# Patient Record
Sex: Female | Born: 1950 | Race: Black or African American | Hispanic: No | Marital: Married | Smoking: Current some day smoker
Health system: Southern US, Community
[De-identification: ages and names within clinical notes are randomized; demographics above are authoritative.]

## PROBLEM LIST (undated history)

## (undated) DIAGNOSIS — N200 Calculus of kidney: Secondary | ICD-10-CM

## (undated) DIAGNOSIS — I10 Essential (primary) hypertension: Secondary | ICD-10-CM

## (undated) DIAGNOSIS — K219 Gastro-esophageal reflux disease without esophagitis: Secondary | ICD-10-CM

## (undated) DIAGNOSIS — R7303 Prediabetes: Secondary | ICD-10-CM

## (undated) DIAGNOSIS — E7800 Pure hypercholesterolemia, unspecified: Secondary | ICD-10-CM

## (undated) HISTORY — PX: KIDNEY STONE SURGERY: SHX686

## (undated) HISTORY — PX: ABDOMINAL HYSTERECTOMY: SHX81

## (undated) HISTORY — PX: OTHER SURGICAL HISTORY: SHX169

---

## 2000-06-17 ENCOUNTER — Observation Stay (HOSPITAL_COMMUNITY): Admission: EM | Admit: 2000-06-17 | Discharge: 2000-06-18 | Payer: Self-pay | Admitting: *Deleted

## 2000-06-17 ENCOUNTER — Encounter: Payer: Self-pay | Admitting: *Deleted

## 2000-06-18 ENCOUNTER — Encounter: Payer: Self-pay | Admitting: Internal Medicine

## 2001-04-06 ENCOUNTER — Ambulatory Visit (HOSPITAL_COMMUNITY): Admission: RE | Admit: 2001-04-06 | Discharge: 2001-04-06 | Payer: Self-pay | Admitting: *Deleted

## 2001-04-06 ENCOUNTER — Encounter: Payer: Self-pay | Admitting: *Deleted

## 2002-07-31 ENCOUNTER — Ambulatory Visit (HOSPITAL_COMMUNITY): Admission: RE | Admit: 2002-07-31 | Discharge: 2002-07-31 | Payer: Self-pay | Admitting: *Deleted

## 2002-07-31 ENCOUNTER — Encounter: Payer: Self-pay | Admitting: *Deleted

## 2003-09-11 ENCOUNTER — Emergency Department (HOSPITAL_COMMUNITY): Admission: EM | Admit: 2003-09-11 | Discharge: 2003-09-11 | Payer: Self-pay | Admitting: Emergency Medicine

## 2006-10-18 ENCOUNTER — Emergency Department (HOSPITAL_COMMUNITY): Admission: EM | Admit: 2006-10-18 | Discharge: 2006-10-18 | Payer: Self-pay | Admitting: Emergency Medicine

## 2006-10-22 ENCOUNTER — Emergency Department (HOSPITAL_COMMUNITY): Admission: EM | Admit: 2006-10-22 | Discharge: 2006-10-22 | Payer: Self-pay | Admitting: Emergency Medicine

## 2007-01-02 ENCOUNTER — Ambulatory Visit (HOSPITAL_COMMUNITY): Admission: RE | Admit: 2007-01-02 | Discharge: 2007-01-02 | Payer: Self-pay | Admitting: Neurological Surgery

## 2007-05-21 ENCOUNTER — Inpatient Hospital Stay (HOSPITAL_COMMUNITY): Admission: EM | Admit: 2007-05-21 | Discharge: 2007-05-23 | Payer: Self-pay | Admitting: Emergency Medicine

## 2007-06-22 ENCOUNTER — Inpatient Hospital Stay (HOSPITAL_COMMUNITY): Admission: RE | Admit: 2007-06-22 | Discharge: 2007-06-26 | Payer: Self-pay | Admitting: Internal Medicine

## 2007-06-22 ENCOUNTER — Encounter: Payer: Self-pay | Admitting: Obstetrics and Gynecology

## 2007-07-12 ENCOUNTER — Ambulatory Visit (HOSPITAL_COMMUNITY): Admission: RE | Admit: 2007-07-12 | Discharge: 2007-07-12 | Payer: Self-pay | Admitting: Urology

## 2007-08-04 ENCOUNTER — Emergency Department (HOSPITAL_COMMUNITY): Admission: EM | Admit: 2007-08-04 | Discharge: 2007-08-04 | Payer: Self-pay | Admitting: Emergency Medicine

## 2007-11-03 ENCOUNTER — Ambulatory Visit (HOSPITAL_COMMUNITY): Admission: RE | Admit: 2007-11-03 | Discharge: 2007-11-03 | Payer: Self-pay | Admitting: Urology

## 2007-11-15 ENCOUNTER — Ambulatory Visit (HOSPITAL_COMMUNITY): Admission: RE | Admit: 2007-11-15 | Discharge: 2007-11-15 | Payer: Self-pay | Admitting: Urology

## 2008-07-12 ENCOUNTER — Ambulatory Visit (HOSPITAL_COMMUNITY): Admission: RE | Admit: 2008-07-12 | Discharge: 2008-07-12 | Payer: Self-pay | Admitting: Urology

## 2009-06-05 IMAGING — CT CT ABDOMEN W/ CM
1 of 3 series · 14 of 32 positions shown, 19 images · IV contrast (Omnipaque 300)
Comparison: None

CLINICAL DATA: Abdominal pain

CT ABDOMEN WITH CONTRAST,CT PELVIS WITH CONTRAST
TECHNIQUE: Multidetector CT imaging of the abdomen was performed
following the standard protocol during bolus administration of
intravenous contrast.,Technique:  Multidetector CT imaging of the
pelvis was performed following the standard protocol during
Contrast: 3 ml Rmnipaque-4HH

[Series 2: abd_pel 5.0 b40f · axial · 0.69mm/px · z∈[-506,-106]mm · 14 of 92 slices shown, 19 images]
[im 6/92  soft-tissue]
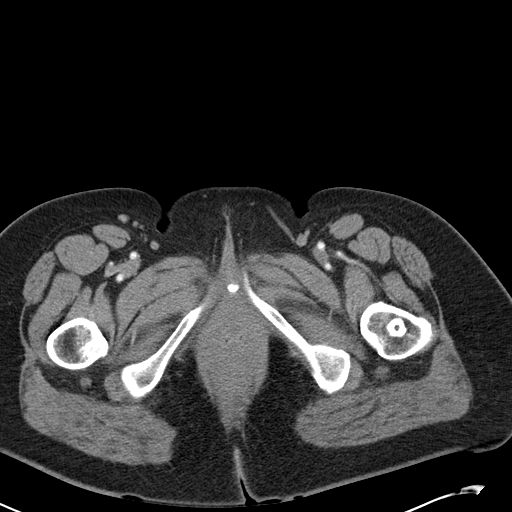
[im 6/92  bone]
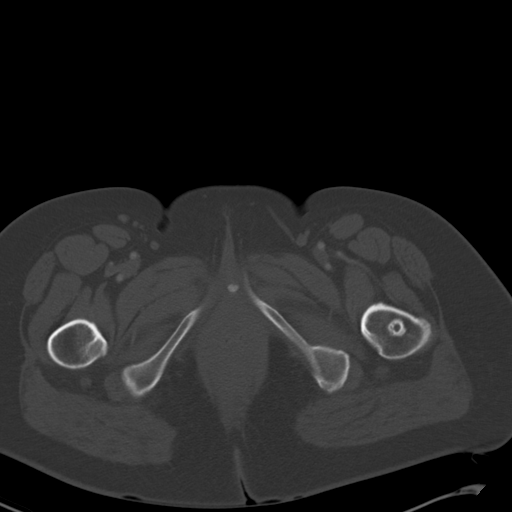
[im 12/92  soft-tissue]
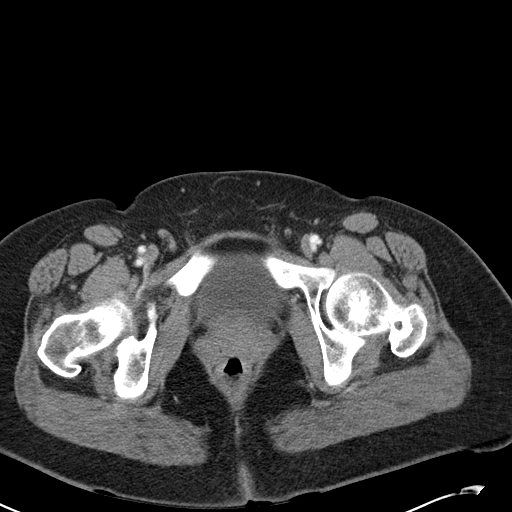
[im 18/92  soft-tissue]
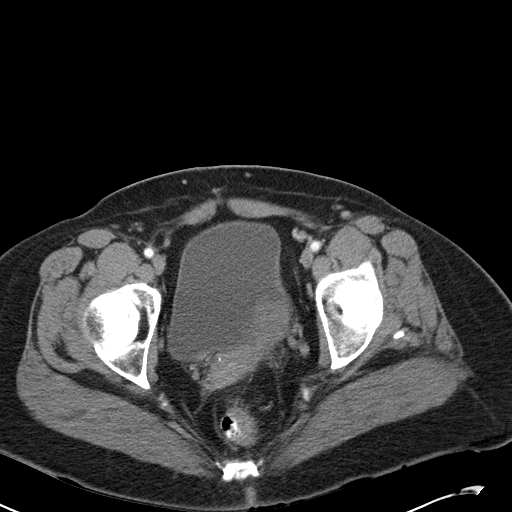
[im 29/92  soft-tissue]
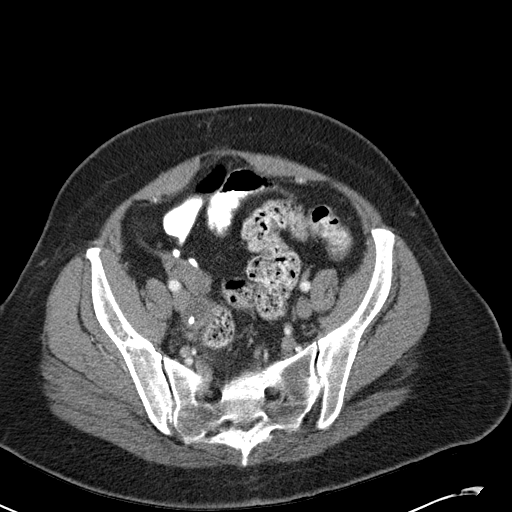
[im 35/92  soft-tissue]
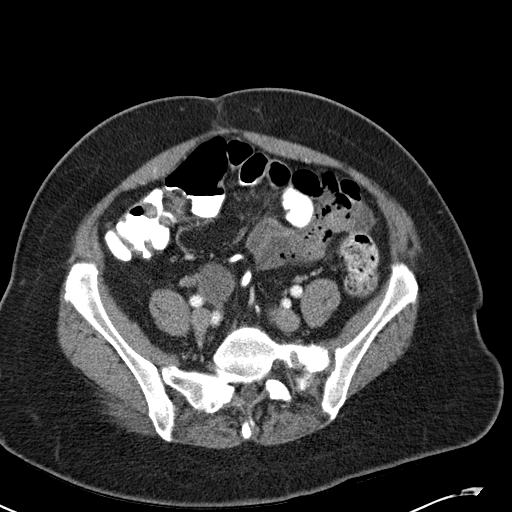
[im 40/92  soft-tissue]
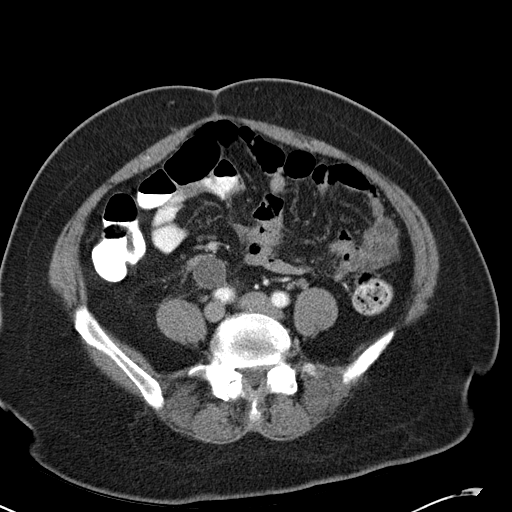
[im 46/92  soft-tissue]
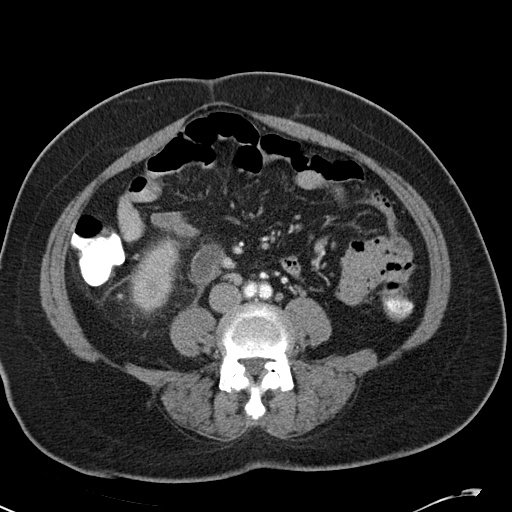
[im 52/92  soft-tissue]
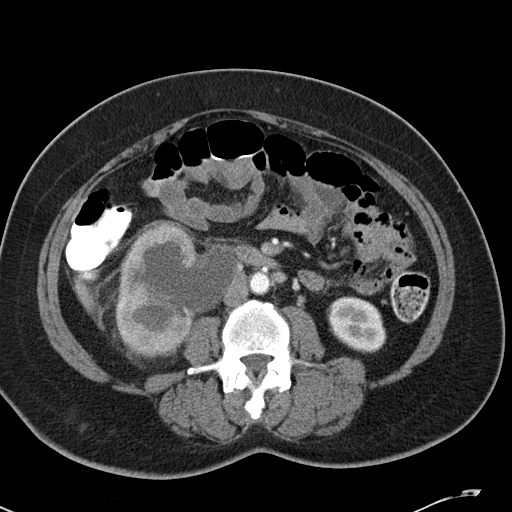
[im 57/92  soft-tissue]
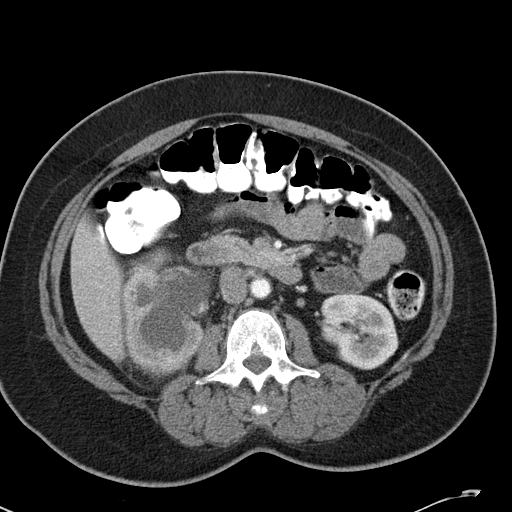
[im 57/92  bone]
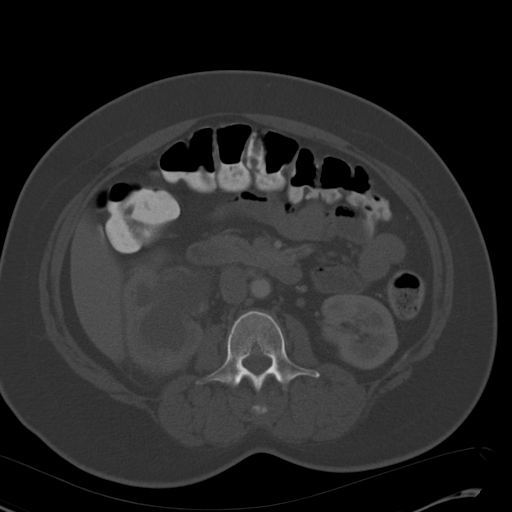
[im 63/92  soft-tissue]
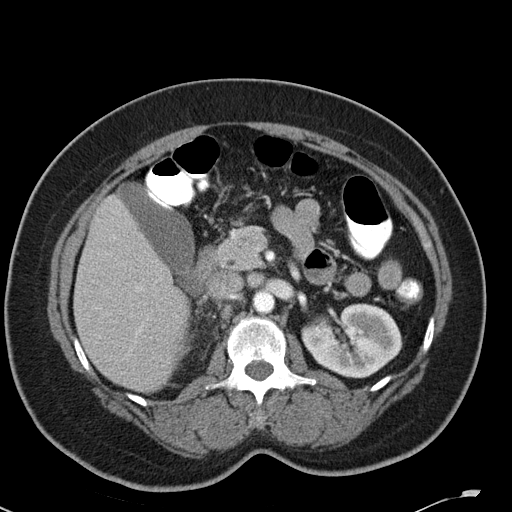
[im 69/92  lung]
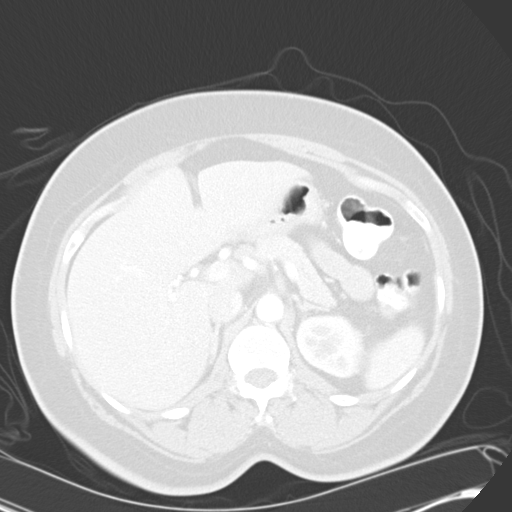
[im 74/92  soft-tissue]
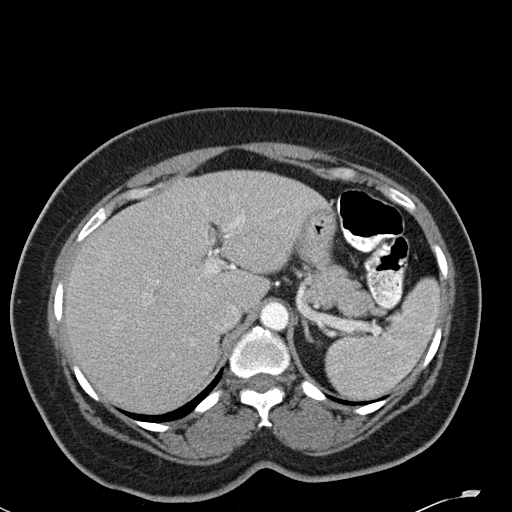
[im 74/92  lung]
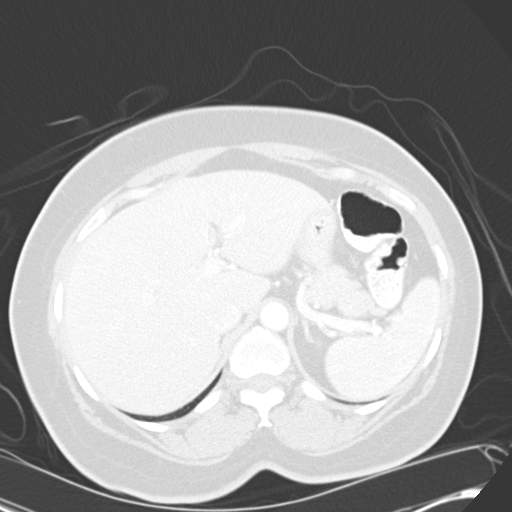
[im 80/92  soft-tissue]
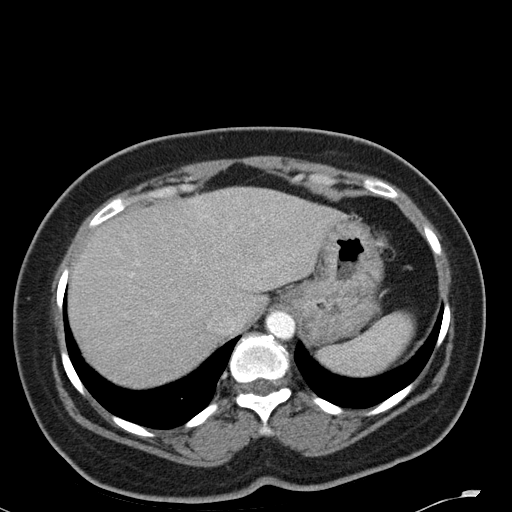
[im 80/92  lung]
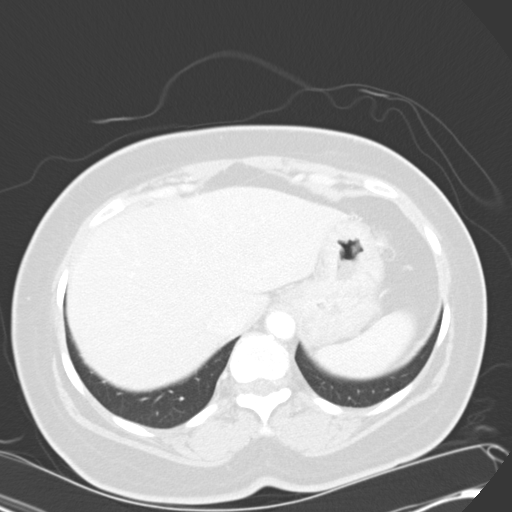
[im 86/92  soft-tissue]
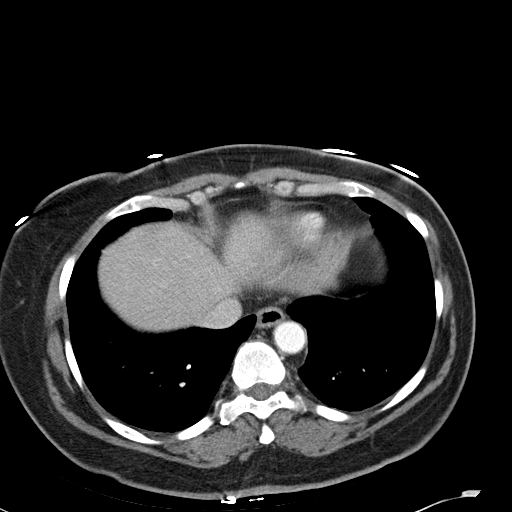
[im 86/92  lung]
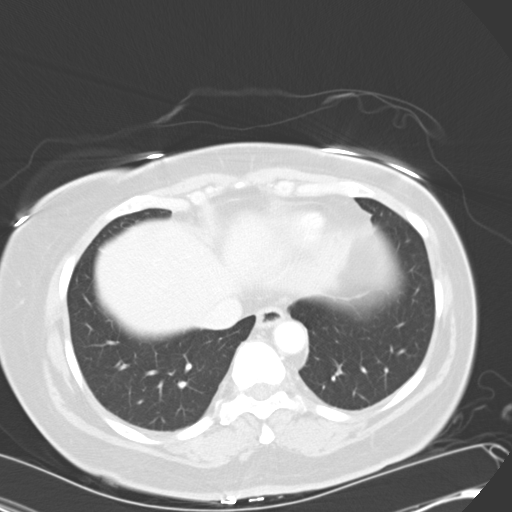

[14 of 32 positions shown; findings below may reference images not displayed]

FINDINGS: Severe right hydronephrosis with marked pelvocaliectasis
of the right kidney.  The right ureter is markedly dilated with a
large calculus within the distal ureter.  In the axial projection
the calculus measures 1.6 cm.  In the coronal projection, the
calculus measures 2.9 cm and I suspect this reflects a conglomerate
of calculi.  No calculi within the kidney are identified.  The left
kidney is normal.  Left lung bases are clear.  There is no
pericardial or pleural effusions.  The liver, spleen pancreas and
gallbladder are normal.  The adrenal glands are unremarkable.
Aorta is normal.  The thoracic and lumbar vertebra normal.

CT pelvis:  The uterus is surgically absent.  A tubular distended
fluid filled mass is seen in the left adnexa.  I suspect this
reflects a hydrosalpinx.  A complex ovarian mass is a differential
consideration.  Ovary is normal.  The sigmoid colon and rectum are
unremarkable.
IMPRESSION: 1.  There are hydronephrosis right kidney.  A conglomerate of
calculi are seen within the distal right ureter.  These measure
cm superior to inferior on the coronal projection.  These findings
have been discussed with Dr. Nesselaar.
2.  Fluid filled tubular structure with a left hemipelvis.  I
suspect this reflects a hydrosalpinx. A complex ovarian mass is a
differential consideration.  The uterus is surgically absent.
Ultrasound may be useful for further characterization.

## 2009-10-17 ENCOUNTER — Emergency Department (HOSPITAL_COMMUNITY): Admission: EM | Admit: 2009-10-17 | Discharge: 2009-10-17 | Payer: Self-pay | Admitting: Emergency Medicine

## 2009-10-23 ENCOUNTER — Emergency Department (HOSPITAL_COMMUNITY): Admission: EM | Admit: 2009-10-23 | Discharge: 2009-10-23 | Payer: Self-pay | Admitting: Emergency Medicine

## 2009-11-21 ENCOUNTER — Emergency Department (HOSPITAL_COMMUNITY): Admission: EM | Admit: 2009-11-21 | Discharge: 2009-11-21 | Payer: Self-pay | Admitting: Emergency Medicine

## 2010-04-15 LAB — URINALYSIS, ROUTINE W REFLEX MICROSCOPIC
Bilirubin Urine: NEGATIVE
Protein, ur: 30 mg/dL — AB
Specific Gravity, Urine: >1.03 — ABNORMAL HIGH (ref 1.005–1.030)

## 2010-04-15 LAB — URINE MICROSCOPIC-ADD ON

## 2010-04-15 LAB — URINE CULTURE: Colony Count: 65000

## 2010-06-16 NOTE — Op Note (Signed)
NAME:  Nancy Woodward, Nancy Woodward           ACCOUNT NO.:  [id number]   MEDICAL RECORD NO.:  [id number]          PATIENT TYPE:  INP   LOCATION:  A321                          FACILITY:  [facility]   PHYSICIAN:  Dennie Maizes, M.D.   DATE OF BIRTH:  [birth year]   DATE OF PROCEDURE:  06/22/2007  DATE OF DISCHARGE:                               OPERATIVE REPORT   PREOPERATIVE DIAGNOSIS:  Large right distal ureteral calculi with  obstruction, right hydronephrosis.   POSTOPERATIVE DIAGNOSIS:  Large right distal ureteral calculi with  obstruction, right hydronephrosis.   OPERATIVE PROCEDURE:  Right ureter lithotomy.   ANESTHESIA:  General.   SURGEON:  Dennie Maizes, MD   ASSISTANT:  Tilda Burrow, MD   COMPLICATIONS:  None.   ESTIMATED BLOOD LOSS:  Minimal.   DRAINS:  JP drain in the pelvis.   SPECIMEN:  Right ureteral calculi, which were given to the patient.   INDICATION FOR PROCEDURE:  This 60 year old female was admitted to  hospital a few weeks ago with a severe right flank pain.  CT scan of the  abdomen and pelvis revealed too large right distal ureteral calculi with  obstruction and hydronephrosis.  The patient also has undergone  cystoscopy, retrograde pyelogram, and right ureteral stent placement.  She was evaluated by Dr. Emelda Fear for her left adnexal mass.  She was  scheduled to undergo exploratory laparotomy and bilateral salpingo-  oophorectomy by Dr. Emelda Fear.  I plan to do right ureteral lithotomy at  the same time.   DESCRIPTION OF PROCEDURE:  General anesthesia was induced and the  patient was placed on the OR table in the supine position.  The abdomen  and genitalia were prepped and draped in a sterile fashion.  Through a  lower midline incision, Dr. Emelda Fear proceeded with bilateral salpingo-  oophorectomy.  After this, the right retroperitoneal space was entered  in the pelvis.  Ureter was identified easily.  The ureter was found to  be thick with thick wall and  adhesions.  Two stones could be palpated in  the ureter.  The stones were about 3 cm apart.  Proximally, the ureter  was identified and the vessel loop was placed to prevent migration of  stone upwards.  Ureterostomy was then made over the lower ureteral  stone.  The stone was extracted with stone-grasping forceps.  Right  ureteral stent was in good position.  I tried to milk the upper ureteral  stone into the lower incision.  This was not possible as the stone was  impacted in the ureter.  Second incision about 3 cm above the lower  incision was made in the ureter.  The upper stone was removed without  any difficulty.  The stone measured 1.5 and 1.4 cm respectively.  They  are quite large for ureteral stones.  The ureteral incision was then  irrigated with saline.  I could passed a feeding tube proximally and  distally in the ureter without any difficulty.  The ureteral incision  was then closed using 4-0 Vicryl sutures.  A JP drain was left in the  pelvis in the retroperitoneal area.  The JP drain was brought through  the stab incision on the right side of abdomen.  This was fixed to the  skin with using a Prolene suture.  The sponges, needle, and instruments  were correct x2 at this time.  The estimated blood loss was minimal for  my procedure.  Dr. Emelda Fear was then proceeded with closure of the  abdominal incision.  The patient was transferred to the PACU in a  satisfactory condition.      Dennie Maizes, M.D.  Electronically Signed     SK/MEDQ  D:  06/22/2007  T:  06/23/2007  Job:  [id number]   cc:   Tilda Burrow, M.D.  Fax: [phone]   [facility]
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
## 2010-06-16 NOTE — Op Note (Signed)
NAME:  Nancy Woodward, Nancy Woodward           ACCOUNT NO.:  [id number]   MEDICAL RECORD NO.:  [id number]          PATIENT TYPE:  INP   LOCATION:  A321                          FACILITY:  [facility]   PHYSICIAN:  Tilda Burrow, M.D. DATE OF BIRTH:  1950/10/26   DATE OF PROCEDURE:  06/22/2007  DATE OF DISCHARGE:                               OPERATIVE REPORT   PREOPERATIVE DIAGNOSES:  Left ovarian mass plus right ureteral stone x2.   POSTOPERATIVE DIAGNOSES:  1. Left hydrosalpinx.  2. Left benign cystic teratoma.  3. Right ureteral stone x2.   PROCEDURES:  1. Bilateral salpingo-oophorectomy with frozen section,  Tilda Burrow, MD  2. Right ureterostomy x2, Dennie Maizes, MD, dictated elsewhere.   ANESTHESIA:  General.   COMPLICATIONS:  None.   FINDINGS:  1. Densely adherent left adnexal cystic structure found to represent      hydrosalpinx on frozen section as well as a benign cystic teratoma      in the adjacent ovary.  2. Small normal right tube and ovary, removed concurrently.  3. Huge ureteral stones on the right, removed by Dr. Rito Ehrlich.   DETAILS OF PROCEDURE:  The patient was taken to the operating room,  prepped and draped in the usual fashion for midline lower abdominal  surgery.  The peritoneal cavity was opened easily.  Omentum was  elevated, separated from its attachments to the pelvic floor, and normal  anatomy relationship was restored.  The bowel could be elevated up and  some epiploic fat appendages on the left side were inspected.  One was  infarcted completely and ecchymotic and hemorrhagic.  This was taken off  as a surgical specimen.  Adjacent epiploic fat appendage adhesions were  taken off.  The left adnexa could be inspected and visualized at this  time.  Bowel was packed away with Balfour retractor in place and 3 lap  tapes moistened, placed in the abdomen and held in place with a  moistened surgical laparotomy dressing.  The suspected left hydrosalpinx  was grasped with Babcock clamp.  The clamps placed on countertraction,  from the pelvic sidewall and gradually serially dissected free from its  extensive lateral sidewall adhesions.  The round ligament could be  identified during this process and was transected, clamped, cut, and  tagged for traction and countertraction.  The retroperitoneum was  entered on the left side and dissected out sufficiently, that the ureter  could be distinctly identified both by palpation and by visualization in  the left retroperitoneal space.  The left infundibulopelvic ligament was  then clamped, cut, and suture ligated once the ureter was confirmed,  being well out of the surgical field.  The left adnexa was then peeled  off the sidewall taking some of left pelvic sidewall and peritoneum with  it.  The ureter was inspected once the tube and ovary were removed and  confirmed as being actively peristaltic without any suspicion of injury.  Specimen was sent for frozen section.   Right salpingo-oophorectomy followed by freeing up of tube and ovary  from its adhesion on the right sidewall which  were less extensive, once  again found in the round ligament which was clamped, cut, and suture  ligated, and the retroperitoneal space was opened and taken all way to  the pelvic brim on the right side allowing identification of the ureter  along its course from the pelvic brim.  Two enlarged nodular areas felt  to represent stones were identified.   Right tube and ovary were taken off as the surgical specimen.  Once the  ureter was clearly identified and was brought out of the way, then the  case was turned over to Dr. Rito Ehrlich, see his notes for details about  the bilateral ureterostomies.  Once the ureterostomies were closed, then  we resumed the procedure for closure.   Closure of the pelvic structures consists of irrigating, confirming an  adequate hemostasis, placement of a Jackson-Pratt drain into the right   pelvic sidewall and allowing this to exit through a stab incision in the  right lower quadrant.  It was sutured in with a 2-0 Vicryl.  The pelvis  was further inspected and hemostasis confirmed.  The peritoneum was  closed separately with 3-0 Dexon over the bottom one-half of the  incision and the rest of the incision closed with running 0-Prolene, 2  segments of suture being used.  The subcu fatty tissue was recontoured  and reapproximated using running 2-0 Prolene.  Staple closure of the  skin followed and completed the procedure in satisfactory condition.  Sponge and needle counts were correct.      Tilda Burrow, M.D.  Electronically Signed     JVF/MEDQ  D:  06/22/2007  T:  06/23/2007  Job:  [id number]
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
## 2010-06-16 NOTE — Discharge Summary (Signed)
NAME:  Nancy Woodward, Nancy Woodward           ACCOUNT NO.:  [id number]   MEDICAL RECORD NO.:  [id number]          PATIENT TYPE:  INP   LOCATION:  A321                          FACILITY:  [facility]   PHYSICIAN:  Tilda Burrow, M.D. DATE OF BIRTH:  01-11-1951   DATE OF ADMISSION:  06/22/2007  DATE OF DISCHARGE:  05/25/2009LH                               DISCHARGE SUMMARY   ADMITTING DIAGNOSIS:  Right distal ureteral calculi with obstruction;  right hydronephrosis, status post right ureteral stent placement; and  left adnexal mass.   DISCHARGE DIAGNOSIS:  Large right ureteral calculi with obstruction;  right hydronephrosis, corrected; status post right ureteral stent  placement; left hydrosalpinx; and left benign cystic teratoma.   PROCEDURE:  1. Right ureterolithotomy, Dr. Rito Ehrlich.  2. Bilateral salpingo-oophorectomy, Dr. Emelda Fear.   DISCHARGE MEDICATIONS:  1. Dilaudid 2 mg tablets one p.o. q.6 h. p.r.n. severe pain, #30.  2. Keflex 500 mg p.o. q.i.d. x2 weeks.  3. Continue prior medicines:  Cyclobenzaprine 10 mg p.o. q.h. p.r.n.      for spasm, hydrochlorothiazide 25 mg p.o. daily, oxycodone p.r.n.      pain, and ibuprofen 800 mg p.r.n. headache.   HOSPITAL SUMMARY:  This 60 year old female was presented with fever,  chills, right lower quadrant pain as described in Dr.  Chancy Milroy notes.  She was referred to my office for a concurrent cystic adnexal mass.  She  was planned for simultaneous surgery for both problems.  CA-125 was  within normal limits, single digits.   HOSPITAL COURSE:  The patient was taken to the operating room.  Laparotomy performed through a midline incision along the previous  incision line from her prior surgery for hysterectomy and C-sections.  Bilateral salpingo-oophorectomy was performed and frozen section  performed identifying a small teratoma as well as hydrosalpinx.  Right  ureterolithotomy was necessary as described in Dr. Chancy Milroy notes.  The surgery was  impressive due to the size of the huge ureteral stones  which were removed.   Postoperative, the patient had a slow recovery.  She had normal  electrolytes and CBC remained minimally elevated.  She received  antibiotics for 24 hours intravenously, and then continued on oral  cephalosporins.  Pain management required greater than usual amounts of  medications, therefore she is sent home on p.o. Dilaudid.  She had  resumption of bowel function, and will be followed up in 3 days at Dr.  Chancy Milroy office and 1 week [facility].   LABORATORY DATA:  At discharge includes hemoglobin 11, hematocrit 31.8,  BUN 12, and creatinine 0.97 compared to admitting BUN of 12 and  creatinine of 1.12.      Tilda Burrow, M.D.  Electronically Signed     JVF/MEDQ  D:  06/26/2007  T:  06/26/2007  Job:  [id number]   cc:   [facility]  [place] [address]  [facility]   Dennie Maizes, M.D.  Fax: [phone]   [facility]  [address] [place]  [id number]

## 2010-06-16 NOTE — Op Note (Signed)
NAME:  Nancy Woodward, Nancy Woodward           ACCOUNT NO.:  [id number]   MEDICAL RECORD NO.:  [id number]          PATIENT TYPE:  AMB   LOCATION:  DAY                           FACILITY:  [facility]   PHYSICIAN:  Dennie Maizes, M.D.   DATE OF BIRTH:  08/22/1950   DATE OF PROCEDURE:  11/15/2007  DATE OF DISCHARGE:                               OPERATIVE REPORT   PREOPERATIVE DIAGNOSES:  1. Chronic right hydronephrosis.  2. Possible right distal ureteral stricture.   POSTOPERATIVE DIAGNOSES:  1. Chronic right hydronephrosis.  2. Right distal ureteral stricture.   OPERATIVE PROCEDURES:  1. Cystoscopy.  2. Right retrograde pyelogram.  3. Balloon dilation of ureteral stricture.  4. Right ureteral stent placement.   ANESTHESIA:  General.   SURGEON:  Dennie Maizes, MD   COMPLICATIONS:  None.   ESTIMATED BLOOD LOSS:  None.   DRAINS:  7.26 cm size right ureteral stent.   SPECIMEN:  None.   COMPLICATIONS:  None.   INDICATIONS FOR PROCEDURE:  A 60 year old female who has undergone right  distal ureter lithotomy in May 2009.  She had been having intermittent  right flank pain.  Evaluation was done with a CT scan of abdomen and  pelvis.  This revealed a right distal ureteral stricture and chronic  hydronephrosis.  The patient was taken to operating room today for  cystoscopy, right retrograde pyelogram, possible balloon dilation of  right distal ureteral stricture, and right ureteral stent placement.   DESCRIPTION OF PROCEDURE:  General anesthesia was induced and the  patient was placed on the OR table in the dorsolithotomy position.  The  lower abdomen and genitalia were prepped and draped in a sterile  fashion.  Cystoscopy was done with a 22-French scope.  The appearance of  bladder was normal.  A 5-French wedge catheter was in place.  The right  ureteral orifice __________ injected into the collecting system and  retrograde pyelogram was done.  Distal ureter was normal.  The rest of  segment of narrowing of the ureter at the level of the pelvic brim about  a 5 cm in length.  This was suggestive of right ureteral stricture.  There was proximal ureter hydronephrosis.   A 5-French open-ended catheter was then placed in the right distal  ureter.  A 0.138 Bentson guide  wire was then inserted in to the right  collecting system without any difficulty.  A 15-French 6 cm length  balloon dilating catheter was inserted into the right distal ureter and  placed at the level of the stricture.  Balloon was inflated and the  stricture dilated with fluoroscopy guidance.  The balloon dilating  catheter was then removed leaving the guidewire in place.  A 7.26 cm  size stent was then inserted into the right collecting system without  any difficulty.  Instruments were removed.  The patient remained stable  throughout the procedure.  The patient was transferred to the PACU in a  satisfactory condition.      Dennie Maizes, M.D.  Electronically Signed     SK/MEDQ  D:  11/15/2007  T:  11/15/2007  Job:  [id number]

## 2010-06-16 NOTE — Op Note (Signed)
NAME:  Nancy Woodward, SCHNOEBELEN           ACCOUNT NO.:  [id number]   MEDICAL RECORD NO.:  [id number]          PATIENT TYPE:  INP   LOCATION:  A337                          FACILITY:  [facility]   PHYSICIAN:  Dennie Maizes, M.D.   DATE OF BIRTH:  May 02, 1950   DATE OF PROCEDURE:  05/22/2007  DATE OF DISCHARGE:                               OPERATIVE REPORT   PREOPERATIVE DIAGNOSES:  Right distal ureteral calculi with obstruction,  right hydronephrosis, and urinary tract infection.   POSTOPERATIVE DIAGNOSES:  Right distal ureteral calculi with  obstruction, right hydronephrosis, and urinary tract infection.   OPERATIVE PROCEDURE:  Cystoscopy, right retrograde pyelogram and right  ureteral stent placement.   ANESTHESIA:  General.   SURGEON:  Dennie Maizes, MD   COMPLICATIONS:  None.   DRAINS:  A 6 French 28 cm size, right ureteral stent.   INDICATIONS FOR PROCEDURE:  This 60 year old female was admitted to  hospital with severe right flank pain and right lower quadrant abdominal  pain.  Her x-rays revealed two large right distal ureteral calculi  measuring about 2.9 cm in size in total.  The patient also had severe  right hydronephrosis and hydroureter.  The urinalysis was suggestive of  urinary tract infection.  She was treated with IV antibiotics and she  was taken to the operating room today for cystoscopy, right retrograde  pyelogram and right ureteral stent placement.  Obstructing stones will  later be treated with ESL, ureteral lithotomy.   DESCRIPTION OF PROCEDURE:  General anesthesia was induced and the  patient was placed on the OR table in the dorsal lithotomy position.  The lower abdomen and genitalia were prepped and draped in a sterile  fashion.  Cystoscopy was done with a 20-French scope.  The appearance of  bladder was normal.  A 5-French wedge catheter was then placed in the  right ureteral orifice.  About 7 mL of Renografin-60 was injected into  the collecting system and  retrograde pyelogram was done.  The distal  ureter was normal for about 5 to 6 cm.  There was a large filling defect  in the right distal ureter suggestive of large stones inside the ureter.  The proximal ureter, renal pelvis, and calices were found to be severely  dilated.  The ureter was found to be dilated and tortuous.   A 5-French open-ended catheter was then placed in the right distal  ureter.  I was unable to pass Bentson guidewire.  A glidewire with an  angled tip was then inserted with some difficulty into the right upper  collecting system.  The open-ended catheter was then placed over the  guidewire and placed in the right upper ureter.  Contrast was injected  and the renal pelvis and calices were opacified.  Due to the tortuosity  of ureter, it was difficult to visualize the renal pelvis and calices.   Through the open-ended catheter, I then inserted a 0.038 Bentson  guidewire into the right renal pelvis.  The open-ended catheter was then  removed.  A 28-cm 6-French right ureteral stent was inserted into the  right collecting system without any difficulty.  The instruments were  removed.  The patient tolerated the procedure well.  She was transferred  to the PACU in a satisfactory condition.      Dennie Maizes, M.D.  Electronically Signed     SK/MEDQ  D:  05/22/2007  T:  05/23/2007  Job:  [id number]   cc:   [facility]  [place], [place]

## 2010-06-19 NOTE — Discharge Summary (Signed)
[place]  Patient:    Nancy Woodward, Nancy Woodward                    MRN: [id number] Adm. Date:  [id number] Disc. Date: [id number] Attending:  Pricilla Riffle Dictator:   Lavella Hammock, P.A. CC:         Dr. Barbera Setters, [facility]   Discharge Summary  DATE OF BIRTH: 1950/02/22  PROCEDURES: Stress Cardiolite.  HISTORY OF PRESENT ILLNESS: Nancy Woodward is a 60 year old female, with no known history of coronary artery disease but whose risk factors include hypertension and hyperlipidemia.  She was awakened by chest pain at 1 a.m. the day of admission, which was described as a pressure sensation and was associated with radiation to the right shoulder, but no diaphoresis or nausea, and lasted about ten minutes.  It reached a 9/10 level.  She came to the emergency room and received aspirin and sublingual nitroglycerin, and her pain resolved.  She was admitted to rule out MI and for further evaluation.  HOSPITAL COURSE: Her enzymes were negative for MI, and she was scheduled for a stress Cardiolite the next day.  She exercised a total of seven minutes, reaching a heart rate of 164, with a goal heart rate of 146.  Cardiolite was injected and she exercised for an additional two minutes.  Her EKG had no acute changes and her blood pressure response to exercise was good.  She had no chest pain during the test.  The Cardiolite showed no scar and no ischemia, and an EF of 77%.  Because she had no further episodes of chest pain and her Cardiolite was negative for scarring or ischemia she was considered stable for discharge on Jun 18, 2000.  LABORATORY DATA: Chest x-ray showed heart and mediastinal contour within normal limits, and lungs clear.  Laboratory values showed hemoglobin of 13.0, hematocrit 38.0; WBC 6.5; platelets 302,000.  Sodium 137, potassium 3.5, chloride 106, CO2 24, BUN 14, creatinine 0.8, glucose 113.  Other CMET values within normal  limits, with lipase within normal limits as well at 35.  Serial CK-MB and troponin I negative for MI.  DISCHARGE CONDITION: Stable.  CONSULTATIONS: None.  COMPLICATIONS: None.  DISCHARGE DIAGNOSES:  1. Chest pain, possibly gastrointestinal in origin.  No myocardial infarction     by enzymes and no ischemia by Cardiolite.  2. Hypertension.  3. Hyperlipidemia.  4. Ongoing tobacco use.  DISCHARGE ACTIVITY: As tolerated.  She is to continue her exercise program.  DISCHARGE DIET: She is to stick to a low-fat diet.  DISCHARGE INSTRUCTIONS: She is to continue a smoking cessation program.  FOLLOW-UP: She is to follow up with Dr. Tenny Craw as needed and is to follow with Dr. Barbera Setters at [facility], and call for an appointment.  DISCHARGE MEDICATIONS:  1. Lipitor 20 mg q.d.  2. Hydrochlorothiazide 25 mg q.d.  3. Pepcid 20 mg one to two tablets q.d. DD:  06/18/00 TD:  06/19/00 Job: [id number]

## 2010-06-21 ENCOUNTER — Emergency Department (HOSPITAL_COMMUNITY): Payer: 59

## 2010-06-21 ENCOUNTER — Emergency Department (HOSPITAL_COMMUNITY)
Admission: EM | Admit: 2010-06-21 | Discharge: 2010-06-21 | Disposition: A | Discharge status: Home / Self Care | Payer: 59 | Attending: Emergency Medicine | Admitting: Emergency Medicine

## 2010-06-21 DIAGNOSIS — Z79899 Other long term (current) drug therapy: Secondary | ICD-10-CM | POA: Insufficient documentation

## 2010-06-21 DIAGNOSIS — R109 Unspecified abdominal pain: Secondary | ICD-10-CM | POA: Insufficient documentation

## 2010-06-21 DIAGNOSIS — I10 Essential (primary) hypertension: Secondary | ICD-10-CM | POA: Insufficient documentation

## 2010-06-21 LAB — URINALYSIS, ROUTINE W REFLEX MICROSCOPIC
Bilirubin Urine: NEGATIVE
Glucose, UA: NEGATIVE mg/dL
Protein, ur: NEGATIVE mg/dL
Urobilinogen, UA: 0.2 mg/dL (ref 0.0–1.0)

## 2010-06-21 LAB — CBC
Hemoglobin: 11.1 g/dL — ABNORMAL LOW (ref 12.0–15.0)
MCH: 29.9 pg (ref 26.0–34.0)
MCV: 91.1 fL (ref 78.0–100.0)
RBC: 3.71 MIL/uL — ABNORMAL LOW (ref 3.87–5.11)
WBC: 6.5 10*3/uL (ref 4.0–10.5)

## 2010-06-21 LAB — DIFFERENTIAL
Basophils Absolute: 0 10*3/uL (ref 0.0–0.1)
Lymphs Abs: 2.4 10*3/uL (ref 0.7–4.0)
Monocytes Relative: 6 % (ref 3–12)

## 2010-06-21 LAB — COMPREHENSIVE METABOLIC PANEL
ALT: 13 U/L (ref 0–35)
AST: 16 U/L (ref 0–37)
Albumin: 3.9 g/dL (ref 3.5–5.2)
Calcium: 10.3 mg/dL (ref 8.4–10.5)
Creatinine, Ser: 0.83 mg/dL (ref 0.4–1.2)
GFR calc Af Amer: >60 mL/min (ref >60)
Sodium: 141 mEq/L (ref 135–145)

## 2010-06-21 LAB — POCT PREGNANCY, URINE: Preg Test, Ur: NEGATIVE

## 2010-06-21 LAB — URINE MICROSCOPIC-ADD ON

## 2010-06-21 MED ORDER — IOHEXOL 300 MG/ML  SOLN
100.0000 mL | Freq: Once | INTRAMUSCULAR | Status: AC | PRN
  Administered 2010-06-21: 100 mL via INTRAVENOUS

## 2010-10-27 LAB — CBC
HCT: 30.6 — ABNORMAL LOW
HCT: 32.4 — ABNORMAL LOW
Hemoglobin: 11.1 — ABNORMAL LOW
MCHC: 34.4
MCV: 86.2
MCV: 86.5
RBC: 3.55 — ABNORMAL LOW
RDW: 13.4
WBC: 7.9

## 2010-10-27 LAB — COMPREHENSIVE METABOLIC PANEL
Alkaline Phosphatase: 79
BUN: 17
Creatinine, Ser: 1.07
Glucose, Bld: 126 — ABNORMAL HIGH
Potassium: 3.8
Total Protein: 7.1

## 2010-10-27 LAB — DIFFERENTIAL
Basophils Absolute: 0
Basophils Relative: 0
Eosinophils Absolute: 0.3
Eosinophils Relative: 4
Lymphs Abs: 2.2
Monocytes Relative: 4
Monocytes Relative: 6
Neutro Abs: 5.3
Neutrophils Relative %: 71

## 2010-10-27 LAB — BASIC METABOLIC PANEL
Chloride: 104
GFR calc Af Amer: >60
GFR calc non Af Amer: 51 — ABNORMAL LOW
Potassium: 3.8
Potassium: 4.4
Sodium: 140

## 2010-10-27 LAB — URINALYSIS, ROUTINE W REFLEX MICROSCOPIC
Bilirubin Urine: NEGATIVE
Glucose, UA: NEGATIVE
Specific Gravity, Urine: 1.02
pH: 5.5

## 2010-10-27 LAB — URINE MICROSCOPIC-ADD ON

## 2010-10-27 LAB — URINE CULTURE

## 2010-10-28 LAB — CBC
HCT: 34.5 — ABNORMAL LOW
Hemoglobin: 11 — ABNORMAL LOW
MCHC: 34.7
MCHC: 34.9
MCV: 86
MCV: 86.2
Platelets: 235
Platelets: 237
RBC: 3.68 — ABNORMAL LOW
RBC: 4.28
WBC: 10.2
WBC: 11.5 — ABNORMAL HIGH

## 2010-10-28 LAB — DIFFERENTIAL
Basophils Relative: 0
Eosinophils Absolute: 0.3
Eosinophils Relative: 1
Lymphocytes Relative: 20
Lymphs Abs: 1.6
Lymphs Abs: 2.1
Monocytes Absolute: 0.5
Monocytes Relative: 5
Neutro Abs: 7.4
Neutrophils Relative %: 73

## 2010-10-28 LAB — COMPREHENSIVE METABOLIC PANEL
AST: 15
BUN: 21
CO2: 28
Calcium: 9.5
Creatinine, Ser: 0.95
GFR calc Af Amer: >60
GFR calc non Af Amer: >60

## 2010-10-28 LAB — BASIC METABOLIC PANEL
CO2: 32
Chloride: 97
GFR calc Af Amer: >60
Potassium: 4
Sodium: 138

## 2010-10-28 LAB — TYPE AND SCREEN: Antibody Screen: NEGATIVE

## 2010-10-29 LAB — URINE CULTURE

## 2010-10-29 LAB — COMPREHENSIVE METABOLIC PANEL
ALT: 16
AST: 14
Albumin: 4
Alkaline Phosphatase: 91
CO2: 27
Chloride: 102
Creatinine, Ser: 0.93
GFR calc Af Amer: >60
GFR calc non Af Amer: >60
Potassium: 4.2
Sodium: 140
Total Bilirubin: 0.5

## 2010-10-29 LAB — URINE MICROSCOPIC-ADD ON

## 2010-10-29 LAB — DIFFERENTIAL
Basophils Absolute: 0
Eosinophils Absolute: 0
Eosinophils Relative: 0
Lymphocytes Relative: 13
Monocytes Absolute: 0.2

## 2010-10-29 LAB — CBC
MCV: 86.5
Platelets: 370
RBC: 4.21
WBC: 11 — ABNORMAL HIGH

## 2010-10-29 LAB — URINALYSIS, ROUTINE W REFLEX MICROSCOPIC
Ketones, ur: NEGATIVE
Nitrite: NEGATIVE
Specific Gravity, Urine: >1.03 — ABNORMAL HIGH

## 2010-11-02 LAB — BASIC METABOLIC PANEL
BUN: 14
Chloride: 106
GFR calc non Af Amer: 57 — ABNORMAL LOW
Glucose, Bld: 105 — ABNORMAL HIGH
Potassium: 4.6
Sodium: 139

## 2010-11-02 LAB — CBC
HCT: 35.9 — ABNORMAL LOW
Hemoglobin: 12.1
MCV: 87.9
Platelets: 385
RDW: 13.1
WBC: 7.2

## 2011-08-31 ENCOUNTER — Emergency Department (HOSPITAL_COMMUNITY)
Admission: EM | Admit: 2011-08-31 | Discharge: 2011-08-31 | Disposition: A | Discharge status: Home / Self Care | Payer: 59 | Attending: Emergency Medicine | Admitting: Emergency Medicine

## 2011-08-31 ENCOUNTER — Encounter (HOSPITAL_COMMUNITY): Payer: Self-pay

## 2011-08-31 DIAGNOSIS — M545 Low back pain, unspecified: Secondary | ICD-10-CM | POA: Insufficient documentation

## 2011-08-31 DIAGNOSIS — F17200 Nicotine dependence, unspecified, uncomplicated: Secondary | ICD-10-CM | POA: Insufficient documentation

## 2011-08-31 DIAGNOSIS — M549 Dorsalgia, unspecified: Secondary | ICD-10-CM

## 2011-08-31 MED ORDER — HYDROCODONE-ACETAMINOPHEN 7.5-325 MG PO TABS: 1.0000 | ORAL_TABLET | ORAL | Status: AC | PRN

## 2011-08-31 MED ORDER — METHOCARBAMOL 500 MG PO TABS: ORAL_TABLET | ORAL | Status: DC

## 2011-08-31 MED ORDER — MELOXICAM 7.5 MG PO TABS: ORAL_TABLET | ORAL | Status: DC

## 2011-08-31 NOTE — ED Notes (Signed)
Pt alert and oriented x 3. Skin warm and dry. Color pink. Pt has dark area under right breast with 2 small pink broken areas. No drainage noted. Pt also c/o lower back pain but denies injury.

## 2011-08-31 NOTE — ED Notes (Signed)
Pt states she found a tick under her right breast x 2 days. Removed it, but since then has had bilateral lower back pain.

## 2011-08-31 NOTE — ED Provider Notes (Signed)
History     CSN: [id number]  Arrival date & time 08/31/11  4098   First MD Initiated Contact with Patient 08/31/11 [phone]      Chief Complaint  Patient presents with  . Back Pain  . Tick Removal    (Consider location/radiation/quality/duration/timing/severity/associated sxs/prior treatment) Patient is a 61 y.o. female presenting with back pain. The history is provided by the patient.  Back Pain  This is a new problem. The current episode started more than 2 days ago. The problem occurs daily. The problem has not changed since onset.The pain is associated with no known injury. The pain is present in the lumbar spine. The quality of the pain is described as aching. The pain does not radiate. The pain is moderate. The symptoms are aggravated by bending, twisting and certain positions. The pain is worse during the day. Pertinent negatives include no chest pain, no abdominal pain, no bowel incontinence, no perianal numbness, no bladder incontinence, no dysuria and no paresthesias. She has tried nothing for the symptoms.    History reviewed. No pertinent past medical history.  Past Surgical History  Procedure Date  . Abdominal hysterectomy     History reviewed. No pertinent family history.  History  Substance Use Topics  . Smoking status: Current Everyday Smoker -- 0.2 packs/day    Types: Cigarettes  . Smokeless tobacco: Not on file  . Alcohol Use: No    OB History    Grav Para Term Preterm Abortions TAB SAB Ect Mult Living                  Review of Systems  Constitutional: Negative for activity change.       All ROS Neg except as noted in HPI  HENT: Negative for nosebleeds and neck pain.   Eyes: Negative for photophobia and discharge.  Respiratory: Negative for cough, shortness of breath and wheezing.   Cardiovascular: Negative for chest pain and palpitations.  Gastrointestinal: Negative for abdominal pain, blood in stool and bowel incontinence.  Genitourinary: Negative  for bladder incontinence, dysuria, frequency and hematuria.  Musculoskeletal: Positive for back pain. Negative for arthralgias.  Skin: Negative.   Neurological: Negative for dizziness, seizures, speech difficulty and paresthesias.  Psychiatric/Behavioral: Negative for hallucinations and confusion.    Allergies  Review of patient's allergies indicates no known allergies.  Home Medications   Current Outpatient Rx  Name Route Sig Dispense Refill  . HYDROCODONE-ACETAMINOPHEN 7.5-325 MG PO TABS Oral Take 1 tablet by mouth every 4 (four) hours as needed for pain. 20 tablet 0  . MELOXICAM 7.5 MG PO TABS  1 po bid with food 12 tablet 0  . METHOCARBAMOL 500 MG PO TABS  2 po tid for spasm/pain 30 tablet 0    BP 129/74  Pulse 75  Temp 98.2 F (36.8 C) (Oral)  Resp 16  SpO2 98%  Physical Exam  Nursing note and vitals reviewed. Constitutional: She is oriented to person, place, and time. She appears well-developed and well-nourished.  Non-toxic appearance.  HENT:  Head: Normocephalic.  Right Ear: Tympanic membrane and external ear normal.  Left Ear: Tympanic membrane and external ear normal.  Eyes: EOM and lids are normal. Pupils are equal, round, and reactive to light.  Neck: Normal range of motion. Neck supple. Carotid bruit is not present.  Cardiovascular: Normal rate, regular rhythm, normal heart sounds, intact distal pulses and normal pulses.   Pulmonary/Chest: Breath sounds normal. No respiratory distress.  Abdominal: Soft. Bowel sounds are normal. There  is no tenderness. There is no guarding and no CVA tenderness.  Musculoskeletal: Normal range of motion.       Lumbar area pain with ROM  And palpation. No palpable step off.  Lymphadenopathy:       Head (right side): No submandibular adenopathy present.       Head (left side): No submandibular adenopathy present.    She has no cervical adenopathy.  Neurological: She is alert and oriented to person, place, and time. She has normal  strength. No cranial nerve deficit or sensory deficit.  Skin: Skin is warm and dry.  Psychiatric: She has a normal mood and affect. Her speech is normal.    ED Course  Procedures (including critical care time)  Labs Reviewed - No data to display No results found.   1. Back pain       MDM  I have reviewed nursing notes, vital signs, and all appropriate lab and imaging results for this patient. No acute changes noted on exam. Exam more c/w lumbar strain. Rx for mobic, Robaxin, and Norco given to the patient. Pt to follow up with her primary MD.       Kathie Dike, PA 08/31/11 1610  Kathie Dike, PA 09/01/11 [phone]

## 2011-09-01 NOTE — ED Provider Notes (Signed)
Medical screening examination/treatment/procedure(s) were performed by non-physician practitioner and as supervising physician I was immediately available for consultation/collaboration.   Joya Gaskins, MD 09/01/11 [phone]

## 2012-01-24 ENCOUNTER — Emergency Department (HOSPITAL_COMMUNITY)
Admission: EM | Admit: 2012-01-24 | Discharge: 2012-01-25 | Disposition: A | Discharge status: Home / Self Care | Payer: 59 | Attending: Emergency Medicine | Admitting: Emergency Medicine

## 2012-01-24 ENCOUNTER — Emergency Department (HOSPITAL_COMMUNITY): Payer: 59

## 2012-01-24 ENCOUNTER — Encounter (HOSPITAL_COMMUNITY): Payer: Self-pay | Admitting: *Deleted

## 2012-01-24 DIAGNOSIS — Z79899 Other long term (current) drug therapy: Secondary | ICD-10-CM | POA: Insufficient documentation

## 2012-01-24 DIAGNOSIS — R0789 Other chest pain: Secondary | ICD-10-CM | POA: Insufficient documentation

## 2012-01-24 DIAGNOSIS — E785 Hyperlipidemia, unspecified: Secondary | ICD-10-CM | POA: Insufficient documentation

## 2012-01-24 DIAGNOSIS — F17200 Nicotine dependence, unspecified, uncomplicated: Secondary | ICD-10-CM | POA: Insufficient documentation

## 2012-01-24 DIAGNOSIS — Z7982 Long term (current) use of aspirin: Secondary | ICD-10-CM | POA: Insufficient documentation

## 2012-01-24 DIAGNOSIS — I10 Essential (primary) hypertension: Secondary | ICD-10-CM | POA: Insufficient documentation

## 2012-01-24 DIAGNOSIS — Z87442 Personal history of urinary calculi: Secondary | ICD-10-CM | POA: Insufficient documentation

## 2012-01-24 DIAGNOSIS — R079 Chest pain, unspecified: Secondary | ICD-10-CM

## 2012-01-24 DIAGNOSIS — Z9889 Other specified postprocedural states: Secondary | ICD-10-CM | POA: Insufficient documentation

## 2012-01-24 DIAGNOSIS — K219 Gastro-esophageal reflux disease without esophagitis: Secondary | ICD-10-CM

## 2012-01-24 DIAGNOSIS — E7800 Pure hypercholesterolemia, unspecified: Secondary | ICD-10-CM | POA: Insufficient documentation

## 2012-01-24 HISTORY — DX: Essential (primary) hypertension: I10

## 2012-01-24 HISTORY — DX: Calculus of kidney: N20.0

## 2012-01-24 LAB — POCT I-STAT TROPONIN I: Troponin i, poc: 0 ng/mL (ref 0.00–0.08)

## 2012-01-24 MED ORDER — GI COCKTAIL ~~LOC~~
30.0000 mL | Freq: Once | ORAL | Status: AC
  Administered 2012-01-24: 30 mL via ORAL
  Filled 2012-01-24: qty 30

## 2012-01-24 MED ORDER — ASPIRIN 81 MG PO CHEW
324.0000 mg | CHEWABLE_TABLET | Freq: Once | ORAL | Status: AC
  Administered 2012-01-24: 324 mg via ORAL
  Filled 2012-01-24: qty 4

## 2012-01-24 MED ORDER — KETOROLAC TROMETHAMINE 30 MG/ML IJ SOLN
30.0000 mg | Freq: Once | INTRAMUSCULAR | Status: AC
  Administered 2012-01-24: 30 mg via INTRAVENOUS
  Filled 2012-01-24: qty 1

## 2012-01-24 MED ORDER — FAMOTIDINE IN NACL 20-0.9 MG/50ML-% IV SOLN
20.0000 mg | Freq: Once | INTRAVENOUS | Status: AC
  Administered 2012-01-24: 20 mg via INTRAVENOUS
  Filled 2012-01-24: qty 50

## 2012-01-24 MED ORDER — OMEPRAZOLE 20 MG PO CPDR: DELAYED_RELEASE_CAPSULE | ORAL | Status: DC

## 2012-01-24 NOTE — ED Notes (Signed)
Pt reports chest pain the started about  3 hours ago. Pt denies any SOB, nausea or vomiting.

## 2012-01-24 NOTE — ED Notes (Signed)
Chest pain, onset 20 min pta, when watching tv.

## 2012-01-24 NOTE — ED Provider Notes (Signed)
History    This chart was scribed for Ward Givens, MD, MD by Smitty Pluck, ED Scribe. The patient was seen in room APA06 and the patient's care was started at 8:52PM.   CSN: [id number]  Arrival date & time 01/24/12  1927      Chief Complaint  Patient presents with  . Chest Pain    (Consider location/radiation/quality/duration/timing/severity/associated sxs/prior treatment) Patient is a 61 y.o. female presenting with chest pain. The history is provided by the patient. No language interpreter was used.  Chest Pain The chest pain began 3 - 5 hours ago. Chest pain occurs constantly. The chest pain is unchanged. The severity of the pain is moderate. The quality of the pain is described as sharp. The pain does not radiate. Pertinent negatives for primary symptoms include no shortness of breath, no abdominal pain and no nausea.  Pertinent negatives for associated symptoms include no diaphoresis. She tried antacids for the symptoms.    Nancy Woodward is a 61 y.o. female with hx of HTN and hyperlipidemia who presents to the Emergency Department complaining of constant, moderate, sharp central chest pain onset 4 hours ago. Pt reports that she was a passenger riding in car when the pain started. Pt reports that she took TUM with minor relief but then pain returned. She reports sitting still makes the pain worse and moving around makes the pain better. She denies SOB, diaphoresis, abdominal pain, radiation of pain, nausea and any other symptoms. Pt reports having similar pain 5 years ago after she ate an apple.    Pt reports taking HTN and hyperlipidemia medication. Pt reports that she smokes cigarettes.   PCP is at [facility]  Family hx of MI (father died at 39 of MI)   Past Medical History  Diagnosis Date  . Hypertension   . Kidney stone    high cholesterol  Past Surgical History  Procedure Date  . Abdominal hysterectomy   . Kidney stone surgery   . Tumor of ovary  removed     History reviewed. No pertinent family history. Father patient died age 43 heart problems  History  Substance Use Topics  . Smoking status: Current Every Day Smoker -- 0.2 packs/day    Types: Cigarettes  . Smokeless tobacco: Not on file  . Alcohol Use: No   employed at [employer]  OB History    Grav Para Term Preterm Abortions TAB SAB Ect Mult Living                  Review of Systems  Constitutional: Negative for diaphoresis.  Respiratory: Negative for shortness of breath.   Cardiovascular: Positive for chest pain.  Gastrointestinal: Negative for nausea and abdominal pain.  All other systems reviewed and are negative.    Allergies  Review of patient's allergies indicates no known allergies.  Home Medications   Current Outpatient Rx  Name  Route  Sig  Dispense  Refill  . ASPIRIN EC 81 MG PO TBEC   Oral   Take 81 mg by mouth daily.         Marland Kitchen HYDROCHLOROTHIAZIDE 12.5 MG PO CAPS   Oral   Take 12.5 mg by mouth daily.         Marland Kitchen LISINOPRIL 40 MG PO TABS   Oral   Take 40 mg by mouth daily.         . NYSTATIN 100000 UNIT/GM EX CREA   Topical   Apply 1 application topically daily as  needed. For irritation         . PRAVASTATIN SODIUM 40 MG PO TABS   Oral   Take 40 mg by mouth at bedtime.           BP 148/75  Pulse 64  Temp 97.9 F (36.6 C) (Oral)  Resp 20  Ht 5\' 6"  (1.676 m)  Wt 184 lb (83.462 kg)  BMI 29.70 kg/m2  SpO2 99%  Vital signs normal    Physical Exam  Nursing note and vitals reviewed. Constitutional: She is oriented to person, place, and time. She appears well-developed and well-nourished.  Non-toxic appearance. She does not appear ill. No distress.  HENT:  Head: Normocephalic and atraumatic.  Right Ear: External ear normal.  Left Ear: External ear normal.  Nose: Nose normal. No mucosal edema or rhinorrhea.  Mouth/Throat: Oropharynx is clear and moist and mucous membranes are normal. No dental abscesses or uvula  swelling.  Eyes: Conjunctivae normal and EOM are normal. Pupils are equal, round, and reactive to light.  Neck: Normal range of motion and full passive range of motion without pain. Neck supple.  Cardiovascular: Normal rate, regular rhythm and normal heart sounds.  Exam reveals no gallop and no friction rub.   No murmur heard. Pulmonary/Chest: Effort normal and breath sounds normal. No respiratory distress. She has no wheezes. She has no rhonchi. She has no rales. She exhibits no tenderness and no crepitus.  Abdominal: Soft. Normal appearance and bowel sounds are normal. She exhibits no distension. There is no tenderness. There is no rebound and no guarding.  Musculoskeletal: Normal range of motion. She exhibits no edema and no tenderness.       Moves all extremities well.   Neurological: She is alert and oriented to person, place, and time. She has normal strength. No cranial nerve deficit.  Skin: Skin is warm, dry and intact. No rash noted. No erythema. No pallor.  Psychiatric: She has a normal mood and affect. Her speech is normal and behavior is normal. Her mood appears not anxious.    ED Course  Procedures (including critical care time) DIAGNOSTIC STUDIES: Oxygen Saturation is 99% on room air, normal by my interpretation.    COORDINATION OF CARE: 9:00 PM Discussed ED treatment with pt  9:00 PM Ordered:   Medications  famotidine (PEPCID) IVPB 20 mg (20 mg Intravenous New Bag/Given 01/24/12 2114)  aspirin chewable tablet 324 mg (324 mg Oral Given 01/24/12 2009)  ketorolac (TORADOL) 30 MG/ML injection 30 mg (30 mg Intravenous Given 01/24/12 2109)  gi cocktail (Maalox,Lidocaine,Donnatal) (30 mL Oral Given 01/24/12 2111)   Pt states her pain is gone. States she also has a lot of burping. Discussed starting her on a PPI, and that she is not having heart problems tonight,.    Results for orders placed during the hospital encounter of 01/24/12  POCT I-STAT TROPONIN I      Component  Value Range   Troponin i, poc 0.01  0.00 - 0.08 ng/mL   Comment 3           POCT I-STAT TROPONIN I      Component Value Range   Troponin i, poc 0.00  0.00 - 0.08 ng/mL   Comment 3             Laboratory interpretation all normal   Dg Chest Portable 1 View  01/24/2012  *RADIOLOGY REPORT*  Clinical Data: Chest pain.  PORTABLE CHEST - 1 VIEW  Comparison: No priors.  Findings: Lung  volumes are normal.  No consolidative airspace disease.  No pleural effusions.  No pneumothorax.  No pulmonary nodule or mass noted.  Pulmonary vasculature and the cardiomediastinal silhouette are within normal limits.  IMPRESSION: 1. No radiographic evidence of acute cardiopulmonary disease.   Original Report Authenticated By: Trudie Reed, M.D.      Date: 01/24/2012  Rate: 64  Rhythm: normal sinus rhythm  QRS Axis: normal  Intervals: normal  ST/T Wave abnormalities: normal  Conduction Disutrbances:none  Narrative Interpretation:  Low voltage  Old EKG Reviewed: unchanged from 06/20/2007    1. Chest pain   2. GERD (gastroesophageal reflux disease)   3. Chest pain, atypical     New Prescriptions   OMEPRAZOLE (PRILOSEC) 20 MG CAPSULE    Take 1 po BID x 2 weeks then once a day    Plan discharge  Devoria Albe, MD, FACEP   MDM   I personally performed the services described in this documentation, which was scribed in my presence. The recorded information has been reviewed and considered.  Devoria Albe, MD, FACEP        Ward Givens, MD 01/25/12 0001

## 2012-01-25 NOTE — ED Notes (Signed)
Pt alert & oriented x4, stable gait. Patient given discharge instructions, paperwork & prescription(s). Patient  instructed to stop at the registration desk to finish any additional paperwork. Patient verbalized understanding. Pt left department w/ no further questions. 

## 2012-12-13 ENCOUNTER — Encounter (HOSPITAL_COMMUNITY): Payer: Self-pay | Admitting: Emergency Medicine

## 2012-12-13 ENCOUNTER — Emergency Department (HOSPITAL_COMMUNITY)
Admission: EM | Admit: 2012-12-13 | Discharge: 2012-12-13 | Disposition: A | Discharge status: Home / Self Care | Payer: 59 | Attending: Emergency Medicine | Admitting: Emergency Medicine

## 2012-12-13 ENCOUNTER — Emergency Department (HOSPITAL_COMMUNITY): Payer: 59

## 2012-12-13 DIAGNOSIS — Z7982 Long term (current) use of aspirin: Secondary | ICD-10-CM | POA: Insufficient documentation

## 2012-12-13 DIAGNOSIS — Z87442 Personal history of urinary calculi: Secondary | ICD-10-CM | POA: Insufficient documentation

## 2012-12-13 DIAGNOSIS — R072 Precordial pain: Secondary | ICD-10-CM | POA: Insufficient documentation

## 2012-12-13 DIAGNOSIS — I10 Essential (primary) hypertension: Secondary | ICD-10-CM | POA: Insufficient documentation

## 2012-12-13 DIAGNOSIS — F17200 Nicotine dependence, unspecified, uncomplicated: Secondary | ICD-10-CM | POA: Insufficient documentation

## 2012-12-13 DIAGNOSIS — R079 Chest pain, unspecified: Secondary | ICD-10-CM

## 2012-12-13 DIAGNOSIS — Z79899 Other long term (current) drug therapy: Secondary | ICD-10-CM | POA: Insufficient documentation

## 2012-12-13 LAB — BASIC METABOLIC PANEL
BUN: 18 mg/dL (ref 6–23)
CO2: 26 mEq/L (ref 19–32)
Calcium: 9.3 mg/dL (ref 8.4–10.5)
Chloride: 105 mEq/L (ref 96–112)
Creatinine, Ser: 0.98 mg/dL (ref 0.50–1.10)
GFR calc Af Amer: 71 mL/min — ABNORMAL LOW (ref >90)
GFR calc non Af Amer: 61 mL/min — ABNORMAL LOW (ref >90)
Glucose, Bld: 100 mg/dL — ABNORMAL HIGH (ref 70–99)
Sodium: 140 mEq/L (ref 135–145)

## 2012-12-13 LAB — CBC WITH DIFFERENTIAL/PLATELET
HCT: 35.6 % — ABNORMAL LOW (ref 36.0–46.0)
Hemoglobin: 11.9 g/dL — ABNORMAL LOW (ref 12.0–15.0)
Lymphocytes Relative: 45 % (ref 12–46)
MCHC: 33.4 g/dL (ref 30.0–36.0)
Monocytes Absolute: 0.4 10*3/uL (ref 0.1–1.0)
Monocytes Relative: 6 % (ref 3–12)
Neutro Abs: 2.9 10*3/uL (ref 1.7–7.7)
Neutrophils Relative %: 44 % (ref 43–77)
Platelets: 278 10*3/uL (ref 150–400)
RBC: 3.9 MIL/uL (ref 3.87–5.11)
WBC: 6.6 10*3/uL (ref 4.0–10.5)

## 2012-12-13 LAB — HEPATIC FUNCTION PANEL
Albumin: 3.4 g/dL — ABNORMAL LOW (ref 3.5–5.2)
Alkaline Phosphatase: 83 U/L (ref 39–117)
Bilirubin, Direct: <0.1 mg/dL (ref 0.0–0.3)
Total Protein: 6.8 g/dL (ref 6.0–8.3)

## 2012-12-13 LAB — TROPONIN I
Troponin I: <0.3 ng/mL (ref <0.30)
Troponin I: <0.3 ng/mL (ref <0.30)

## 2012-12-13 MED ORDER — KETOROLAC TROMETHAMINE 30 MG/ML IJ SOLN
30.0000 mg | Freq: Once | INTRAMUSCULAR | Status: AC
  Administered 2012-12-13: 30 mg via INTRAVENOUS
  Filled 2012-12-13: qty 1

## 2012-12-13 MED ORDER — MORPHINE SULFATE 2 MG/ML IJ SOLN
2.0000 mg | Freq: Once | INTRAMUSCULAR | Status: AC
  Administered 2012-12-13: 2 mg via INTRAVENOUS
  Filled 2012-12-13: qty 1

## 2012-12-13 MED ORDER — PANTOPRAZOLE SODIUM 40 MG IV SOLR
40.0000 mg | Freq: Once | INTRAVENOUS | Status: AC
  Administered 2012-12-13: 40 mg via INTRAVENOUS
  Filled 2012-12-13: qty 40

## 2012-12-13 MED ORDER — IBUPROFEN 800 MG PO TABS: 800.0000 mg | ORAL_TABLET | Freq: Three times a day (TID) | ORAL | Status: DC | PRN

## 2012-12-13 MED ORDER — LORAZEPAM 2 MG/ML IJ SOLN: 1.0000 mg | Freq: Once | INTRAMUSCULAR | Status: DC

## 2012-12-13 NOTE — ED Notes (Signed)
Pt c/o non radiating chest pain/pressure since last night around 1130pm.  Denies any n/v or SOB.  Pt rates pain at 8.

## 2012-12-13 NOTE — ED Provider Notes (Addendum)
CSN: [id number]     Arrival date & time 12/13/12  0704 History  This chart was scribed for Benny Lennert, MD by Ardelia Mems, ED Scribe. This patient was seen in room APA02/APA02 and the patient's care was started at 7:22 AM.   Chief Complaint  Patient presents with  . Chest Pain    Patient is a 62 y.o. female presenting with chest pain. The history is provided by the patient. No language interpreter was used.  Chest Pain Pain location:  Substernal area Pain quality: pressure and sharp   Pain radiates to:  Does not radiate Pain radiates to the back: no   Pain severity:  Moderate Onset quality:  Gradual Duration:  8 hours Timing:  Constant Progression:  Waxing and waning Chronicity:  New Relieved by:  None tried Worsened by:  Nothing tried Ineffective treatments:  None tried Associated symptoms: no abdominal pain, no back pain, no cough, no diaphoresis, no fatigue, no headache, no nausea, no shortness of breath and not vomiting   Risk factors: hypertension and smoking     HPI Comments: ERMAL BRZOZOWSKI is a 62 y.o. female with a history of HTN who presents to the Emergency Department complaining of constant, moderate central chest pain, localized to her sternum, onset last night, about 8 hours ago. She describes her pain as "pressure" and sometimes as "sharp". She states that her pain is non-radiating. She denies having any history of similar chest pain. She denies associated SOB, nausea, emesis or diaphoresis. She is a current every day smoker of 0.25 packs/day.   Past Medical History  Diagnosis Date  . Hypertension   . Kidney stone    Past Surgical History  Procedure Laterality Date  . Abdominal hysterectomy    . Kidney stone surgery    . Tumor of ovary removed     No family history on file. History  Substance Use Topics  . Smoking status: Current Every Day Smoker -- 0.25 packs/day    Types: Cigarettes  . Smokeless tobacco: Not on file  . Alcohol Use: No   OB  History   Grav Para Term Preterm Abortions TAB SAB Ect Mult Living                 Review of Systems  Constitutional: Negative for diaphoresis, appetite change and fatigue.  HENT: Negative for congestion, ear discharge and sinus pressure.   Eyes: Negative for discharge.  Respiratory: Negative for cough and shortness of breath.   Cardiovascular: Positive for chest pain.  Gastrointestinal: Negative for nausea, vomiting, abdominal pain and diarrhea.  Genitourinary: Negative for frequency and hematuria.  Musculoskeletal: Negative for back pain.  Skin: Negative for rash.  Neurological: Negative for seizures and headaches.  Psychiatric/Behavioral: Negative for hallucinations.  All other systems reviewed and are negative.    Allergies  Review of patient's allergies indicates no known allergies.  Home Medications   Current Outpatient Rx  Name  Route  Sig  Dispense  Refill  . aspirin EC 81 MG tablet   Oral   Take 81 mg by mouth daily.         . hydrochlorothiazide (MICROZIDE) 12.5 MG capsule   Oral   Take 12.5 mg by mouth daily.         Marland Kitchen lisinopril (PRINIVIL,ZESTRIL) 40 MG tablet   Oral   Take 40 mg by mouth daily.         . pravastatin (PRAVACHOL) 40 MG tablet   Oral   Take  40 mg by mouth at bedtime.          Triage Vitals: BP 128/76  Pulse 65  Temp(Src) 98.9 F (37.2 C) (Oral)  Resp 22  Wt 184 lb (83.462 kg)  SpO2 100%  Physical Exam  Constitutional: She is oriented to person, place, and time. She appears well-developed.  HENT:  Head: Normocephalic.  Eyes: Conjunctivae and EOM are normal. No scleral icterus.  Neck: Neck supple. No thyromegaly present.  Cardiovascular: Normal rate and regular rhythm.  Exam reveals no gallop and no friction rub.   No murmur heard. Pulmonary/Chest: No stridor. She has no wheezes. She has no rales. She exhibits no tenderness.  Abdominal: She exhibits no distension. There is no tenderness. There is no rebound.   Musculoskeletal: Normal range of motion. She exhibits no edema.  Lymphadenopathy:    She has no cervical adenopathy.  Neurological: She is oriented to person, place, and time. She exhibits normal muscle tone. Coordination normal.  Skin: No rash noted. No erythema.  Psychiatric: She has a normal mood and affect. Her behavior is normal.    ED Course  Procedures (including critical care time)  DIAGNOSTIC STUDIES: Oxygen Saturation is 100% on RA, normal by my interpretation.    COORDINATION OF CARE: 7:27 AM- Discussed plan to obtain a CXR, EKG and diagnostic lab work. Will order Protonix and Morphine. Pt advised of plan for treatment and pt agrees.  8:40 AM- Recheck with pt and pt advised of normal study results. Pt's states that she feels no better after receiving medications in the ED. Advised pt of plan to try a different approach in treatment, and will order Toradol.  Medications  pantoprazole (PROTONIX) injection 40 mg (40 mg Intravenous Given 12/13/12 0827)  morphine 2 MG/ML injection 2 mg (2 mg Intravenous Given 12/13/12 0827)  ketorolac (TORADOL) 30 MG/ML injection 30 mg (30 mg Intravenous Given 12/13/12 0904)    Labs Review Labs Reviewed  CBC WITH DIFFERENTIAL - Abnormal; Notable for the following:    Hemoglobin 11.9 (*)    HCT 35.6 (*)    All other components within normal limits  BASIC METABOLIC PANEL - Abnormal; Notable for the following:    Glucose, Bld 100 (*)    GFR calc non Af Amer 61 (*)    GFR calc Af Amer 71 (*)    All other components within normal limits  HEPATIC FUNCTION PANEL - Abnormal; Notable for the following:    Albumin 3.4 (*)    Total Bilirubin 0.2 (*)    All other components within normal limits  TROPONIN I  TROPONIN I   Imaging Review Dg Chest 2 View  12/13/2012   CLINICAL DATA:  Chest pain.  High blood pressure.  EXAM: CHEST  2 VIEW  COMPARISON:  01/24/2012  FINDINGS: Left base subsegmental atelectasis. No infiltrate, congestive heart  failure or pneumothorax. .  Slightly tortuous aorta.  Heart size top-normal.  No plain film evidence of pulmonary malignancy.  IMPRESSION: Left base subsegmental atelectasis.  Slightly tortuous aorta.   Electronically Signed   By: Bridgett Larsson M.D.   On: 12/13/2012 08:29    EKG Interpretation     Ventricular Rate:  62 PR Interval:  136 QRS Duration: 68 QT Interval:  450 QTC Calculation: 456 R Axis:   9 Text Interpretation:  Normal sinus rhythm Low voltage QRS Borderline ECG When compared with ECG of 24-Jan-2012 19:33, Nonspecific T wave abnormality now evident in Lateral leads  MDM  No diagnosis found.    The chart was scribed for me under my direct supervision.  I personally performed the history, physical, and medical decision making and all procedures in the evaluation of this patient.Benny Lennert, MD 12/13/12 1103  Benny Lennert, MD 12/13/12 [phone]

## 2013-09-18 ENCOUNTER — Emergency Department (HOSPITAL_COMMUNITY)
Admission: EM | Admit: 2013-09-18 | Discharge: 2013-09-18 | Disposition: A | Discharge status: Home / Self Care | Payer: 59 | Attending: Emergency Medicine | Admitting: Emergency Medicine

## 2013-09-18 ENCOUNTER — Emergency Department (HOSPITAL_COMMUNITY): Payer: 59

## 2013-09-18 ENCOUNTER — Encounter (HOSPITAL_COMMUNITY): Payer: Self-pay | Admitting: Emergency Medicine

## 2013-09-18 DIAGNOSIS — E7800 Pure hypercholesterolemia, unspecified: Secondary | ICD-10-CM | POA: Diagnosis not present

## 2013-09-18 DIAGNOSIS — K2100 Gastro-esophageal reflux disease with esophagitis, without bleeding: Secondary | ICD-10-CM

## 2013-09-18 DIAGNOSIS — Z791 Long term (current) use of non-steroidal anti-inflammatories (NSAID): Secondary | ICD-10-CM | POA: Insufficient documentation

## 2013-09-18 DIAGNOSIS — K219 Gastro-esophageal reflux disease without esophagitis: Secondary | ICD-10-CM | POA: Insufficient documentation

## 2013-09-18 DIAGNOSIS — Z87442 Personal history of urinary calculi: Secondary | ICD-10-CM | POA: Insufficient documentation

## 2013-09-18 DIAGNOSIS — I10 Essential (primary) hypertension: Secondary | ICD-10-CM | POA: Diagnosis not present

## 2013-09-18 DIAGNOSIS — Z7982 Long term (current) use of aspirin: Secondary | ICD-10-CM | POA: Diagnosis not present

## 2013-09-18 DIAGNOSIS — Z79899 Other long term (current) drug therapy: Secondary | ICD-10-CM | POA: Diagnosis not present

## 2013-09-18 DIAGNOSIS — F17200 Nicotine dependence, unspecified, uncomplicated: Secondary | ICD-10-CM | POA: Diagnosis not present

## 2013-09-18 DIAGNOSIS — R079 Chest pain, unspecified: Secondary | ICD-10-CM | POA: Insufficient documentation

## 2013-09-18 HISTORY — DX: Gastro-esophageal reflux disease without esophagitis: K21.9

## 2013-09-18 HISTORY — DX: Pure hypercholesterolemia, unspecified: E78.00

## 2013-09-18 LAB — BASIC METABOLIC PANEL
ANION GAP: 14 (ref 5–15)
BUN: 17 mg/dL (ref 6–23)
CALCIUM: 9.5 mg/dL (ref 8.4–10.5)
CO2: 27 meq/L (ref 19–32)
CREATININE: 1.05 mg/dL (ref 0.50–1.10)
Chloride: 102 mEq/L (ref 96–112)
GFR calc Af Amer: 65 mL/min — ABNORMAL LOW (ref >90)
GFR calc non Af Amer: 56 mL/min — ABNORMAL LOW (ref >90)
Glucose, Bld: 144 mg/dL — ABNORMAL HIGH (ref 70–99)
Potassium: 3.5 mEq/L — ABNORMAL LOW (ref 3.7–5.3)
Sodium: 143 mEq/L (ref 137–147)

## 2013-09-18 LAB — CBC
HCT: 38.6 % (ref 36.0–46.0)
Hemoglobin: 12.9 g/dL (ref 12.0–15.0)
MCH: 30.6 pg (ref 26.0–34.0)
MCHC: 33.4 g/dL (ref 30.0–36.0)
MCV: 91.7 fL (ref 78.0–100.0)
PLATELETS: 297 10*3/uL (ref 150–400)
RBC: 4.21 MIL/uL (ref 3.87–5.11)
RDW: 12.7 % (ref 11.5–15.5)
WBC: 8.6 10*3/uL (ref 4.0–10.5)

## 2013-09-18 LAB — TROPONIN I: Troponin I: <0.3 ng/mL (ref <0.30)

## 2013-09-18 MED ORDER — PANTOPRAZOLE SODIUM 20 MG PO TBEC: 20.0000 mg | DELAYED_RELEASE_TABLET | Freq: Every day | ORAL | Status: DC

## 2013-09-18 MED ORDER — GI COCKTAIL ~~LOC~~
30.0000 mL | Freq: Once | ORAL | Status: AC
  Administered 2013-09-18: 30 mL via ORAL
  Filled 2013-09-18: qty 30

## 2013-09-18 MED ORDER — ASPIRIN 325 MG PO TABS
325.0000 mg | ORAL_TABLET | ORAL | Status: DC
  Filled 2013-09-18: qty 1

## 2013-09-18 NOTE — ED Notes (Signed)
Patient resting in bed, even rise and fall of chest. Family member remains at bedside, no needs voiced.

## 2013-09-18 NOTE — ED Provider Notes (Signed)
CSN: [id number]     Arrival date & time 09/18/13  0115 History   First MD Initiated Contact with Patient 09/18/13 0239     Chief Complaint  Patient presents with  . Chest Pain     (Consider location/radiation/quality/duration/timing/severity/associated sxs/prior Treatment) Patient is a 63 y.o. female presenting with chest pain. The history is provided by the patient (the pt complains of chest pain).  Chest Pain Pain location:  Epigastric Pain quality: aching   Pain radiates to:  Does not radiate Pain radiates to the back: no   Pain severity:  Moderate Onset quality:  Sudden Timing:  Constant Progression:  Worsening Chronicity:  Recurrent Associated symptoms: no abdominal pain, no back pain, no cough, no fatigue and no headache     Past Medical History  Diagnosis Date  . Hypertension   . Kidney stone   . Hypercholesteremia   . GERD (gastroesophageal reflux disease)    Past Surgical History  Procedure Laterality Date  . Abdominal hysterectomy    . Kidney stone surgery    . Tumor of ovary removed     History reviewed. No pertinent family history. History  Substance Use Topics  . Smoking status: Current Every Day Smoker -- 0.25 packs/day    Types: Cigarettes  . Smokeless tobacco: Not on file  . Alcohol Use: No   OB History   Grav Para Term Preterm Abortions TAB SAB Ect Mult Living                 Review of Systems  Constitutional: Negative for appetite change and fatigue.  HENT: Negative for congestion, ear discharge and sinus pressure.   Eyes: Negative for discharge.  Respiratory: Negative for cough.   Cardiovascular: Positive for chest pain.  Gastrointestinal: Negative for abdominal pain and diarrhea.  Genitourinary: Negative for frequency and hematuria.  Musculoskeletal: Negative for back pain.  Skin: Negative for rash.  Neurological: Negative for seizures and headaches.  Psychiatric/Behavioral: Negative for hallucinations.      Allergies  Review of  patient's allergies indicates no known allergies.  Home Medications   Prior to Admission medications   Medication Sig Start Date End Date Taking? Authorizing Provider  aspirin EC 81 MG tablet Take 81 mg by mouth daily.    Historical Provider, MD  hydrochlorothiazide (MICROZIDE) 12.5 MG capsule Take 12.5 mg by mouth daily.    Historical Provider, MD  ibuprofen (ADVIL,MOTRIN) 800 MG tablet Take 1 tablet (800 mg total) by mouth every 8 (eight) hours as needed for moderate pain. 12/13/12   Maudry Diego, MD  lisinopril (PRINIVIL,ZESTRIL) 40 MG tablet Take 40 mg by mouth daily.    Historical Provider, MD  pantoprazole (PROTONIX) 20 MG tablet Take 1 tablet (20 mg total) by mouth daily. 09/18/13   Maudry Diego, MD  pravastatin (PRAVACHOL) 40 MG tablet Take 40 mg by mouth at bedtime.    Historical Provider, MD   BP 141/62  Pulse 52  Temp(Src) 97.9 F (36.6 C) (Oral)  Resp 16  Ht 5\' 6"  (1.676 m)  Wt 184 lb (83.462 kg)  BMI 29.71 kg/m2  SpO2 97% Physical Exam  Constitutional: She is oriented to person, place, and time. She appears well-developed.  HENT:  Head: Normocephalic.  Eyes: Conjunctivae and EOM are normal. No scleral icterus.  Neck: Neck supple. No thyromegaly present.  Cardiovascular: Normal rate and regular rhythm.  Exam reveals no gallop and no friction rub.   No murmur heard. Pulmonary/Chest: No stridor. She has no wheezes.  She has no rales. She exhibits no tenderness.  Abdominal: She exhibits no distension. There is no tenderness. There is no rebound.  Musculoskeletal: Normal range of motion. She exhibits no edema.  Lymphadenopathy:    She has no cervical adenopathy.  Neurological: She is oriented to person, place, and time. She exhibits normal muscle tone. Coordination normal.  Skin: No rash noted. No erythema.  Psychiatric: She has a normal mood and affect. Her behavior is normal.    ED Course  Procedures (including critical care time) Labs Review Labs Reviewed   BASIC METABOLIC PANEL - Abnormal; Notable for the following:    Potassium 3.5 (*)    Glucose, Bld 144 (*)    GFR calc non Af Amer 56 (*)    GFR calc Af Amer 65 (*)    All other components within normal limits  CBC  TROPONIN I    Imaging Review Dg Chest Port 1 View  09/18/2013   CLINICAL DATA:  Chest pain.  EXAM: PORTABLE CHEST - 1 VIEW  COMPARISON:  12/13/2012.  FINDINGS: The cardiac silhouette, mediastinal and hilar contours are stable. Low lung volumes with vascular crowding and streaky basilar atelectasis. Possible small left effusion. The bony thorax is intact.  IMPRESSION: Low lung volumes with vascular crowding and atelectasis.  Possible small left effusion.   Electronically Signed   By: Kalman Jewels M.D.   On: 09/18/2013 01:41     EKG Interpretation   Date/Time:  Tuesday September 18 2013 01:26:56 EDT Ventricular Rate:  70 PR Interval:  130 QRS Duration: 122 QT Interval:  540 QTC Calculation: 583 R Axis:   -8 Text Interpretation:  Ectopic atrial rhythm Nonspecific intraventricular  conduction delay Abnormal lateral Q waves Repol abnrm, severe global  ischemia (LM/MVD) Confirmed by Aryiana Klinkner  MD, Ocean Kearley ([id number]) on 09/18/2013  5:31:58 AM      MDM   Final diagnoses:  Gastroesophageal reflux disease with esophagitis    Pt improved with gi cocktail.  Will tx with protonix    Maudry Diego, MD 09/18/13 [phone]

## 2013-09-18 NOTE — ED Notes (Signed)
Patient verbalizes understanding of discharge instructions, prescription medications and follow up plan. Patient ambulatory out of department at this time with family.

## 2013-09-18 NOTE — Discharge Instructions (Signed)
Follow up with your md in a week °

## 2013-09-18 NOTE — ED Notes (Signed)
Patient reports chest pain that started suddenly tonight and woke her up from her sleep.

## 2013-09-18 NOTE — ED Notes (Signed)
Patient states she took 325mg  ASA PTA

## 2013-10-01 ENCOUNTER — Encounter (HOSPITAL_COMMUNITY): Payer: Self-pay | Admitting: Emergency Medicine

## 2013-10-01 ENCOUNTER — Emergency Department (HOSPITAL_COMMUNITY): Payer: 59

## 2013-10-01 ENCOUNTER — Emergency Department (HOSPITAL_COMMUNITY)
Admission: EM | Admit: 2013-10-01 | Discharge: 2013-10-01 | Disposition: A | Discharge status: Home / Self Care | Payer: 59 | Attending: Emergency Medicine | Admitting: Emergency Medicine

## 2013-10-01 DIAGNOSIS — I10 Essential (primary) hypertension: Secondary | ICD-10-CM | POA: Insufficient documentation

## 2013-10-01 DIAGNOSIS — E7800 Pure hypercholesterolemia, unspecified: Secondary | ICD-10-CM | POA: Insufficient documentation

## 2013-10-01 DIAGNOSIS — Z79899 Other long term (current) drug therapy: Secondary | ICD-10-CM | POA: Insufficient documentation

## 2013-10-01 DIAGNOSIS — Z87442 Personal history of urinary calculi: Secondary | ICD-10-CM | POA: Diagnosis not present

## 2013-10-01 DIAGNOSIS — R071 Chest pain on breathing: Secondary | ICD-10-CM | POA: Insufficient documentation

## 2013-10-01 DIAGNOSIS — K219 Gastro-esophageal reflux disease without esophagitis: Secondary | ICD-10-CM | POA: Diagnosis not present

## 2013-10-01 DIAGNOSIS — Z7982 Long term (current) use of aspirin: Secondary | ICD-10-CM | POA: Diagnosis not present

## 2013-10-01 DIAGNOSIS — R079 Chest pain, unspecified: Secondary | ICD-10-CM | POA: Diagnosis present

## 2013-10-01 DIAGNOSIS — F17200 Nicotine dependence, unspecified, uncomplicated: Secondary | ICD-10-CM | POA: Diagnosis not present

## 2013-10-01 DIAGNOSIS — R0789 Other chest pain: Secondary | ICD-10-CM

## 2013-10-01 LAB — CBC WITH DIFFERENTIAL/PLATELET
BASOS PCT: 0 % (ref 0–1)
Basophils Absolute: 0 10*3/uL (ref 0.0–0.1)
Eosinophils Absolute: 0.2 10*3/uL (ref 0.0–0.7)
Eosinophils Relative: 3 % (ref 0–5)
HCT: 37 % (ref 36.0–46.0)
Hemoglobin: 12.4 g/dL (ref 12.0–15.0)
Lymphocytes Relative: 37 % (ref 12–46)
Lymphs Abs: 2.5 10*3/uL (ref 0.7–4.0)
MCH: 30.2 pg (ref 26.0–34.0)
MCHC: 33.5 g/dL (ref 30.0–36.0)
MCV: 90.2 fL (ref 78.0–100.0)
MONO ABS: 0.3 10*3/uL (ref 0.1–1.0)
Monocytes Relative: 5 % (ref 3–12)
NEUTROS PCT: 55 % (ref 43–77)
Neutro Abs: 3.8 10*3/uL (ref 1.7–7.7)
Platelets: 281 10*3/uL (ref 150–400)
RBC: 4.1 MIL/uL (ref 3.87–5.11)
RDW: 12.3 % (ref 11.5–15.5)
WBC: 6.8 10*3/uL (ref 4.0–10.5)

## 2013-10-01 LAB — BASIC METABOLIC PANEL
Anion gap: 10 (ref 5–15)
BUN: 17 mg/dL (ref 6–23)
CALCIUM: 9.3 mg/dL (ref 8.4–10.5)
CO2: 30 mEq/L (ref 19–32)
CREATININE: 1.09 mg/dL (ref 0.50–1.10)
Chloride: 106 mEq/L (ref 96–112)
GFR calc non Af Amer: 54 mL/min — ABNORMAL LOW (ref >90)
GFR, EST AFRICAN AMERICAN: 62 mL/min — AB (ref >90)
Glucose, Bld: 98 mg/dL (ref 70–99)
POTASSIUM: 4.1 meq/L (ref 3.7–5.3)
Sodium: 146 mEq/L (ref 137–147)

## 2013-10-01 LAB — TROPONIN I: Troponin I: <0.3 ng/mL (ref <0.30)

## 2013-10-01 MED ORDER — IBUPROFEN 800 MG PO TABS: 800.0000 mg | ORAL_TABLET | Freq: Three times a day (TID) | ORAL | Status: DC | PRN

## 2013-10-01 MED ORDER — MORPHINE SULFATE 4 MG/ML IJ SOLN
4.0000 mg | Freq: Once | INTRAMUSCULAR | Status: AC
  Administered 2013-10-01: 4 mg via INTRAVENOUS
  Filled 2013-10-01: qty 1

## 2013-10-01 MED ORDER — KETOROLAC TROMETHAMINE 30 MG/ML IJ SOLN
30.0000 mg | Freq: Once | INTRAMUSCULAR | Status: AC
  Administered 2013-10-01: 30 mg via INTRAVENOUS
  Filled 2013-10-01: qty 1

## 2013-10-01 MED ORDER — HYDROCODONE-ACETAMINOPHEN 5-325 MG PO TABS: 1.0000 | ORAL_TABLET | ORAL | Status: DC | PRN

## 2013-10-01 NOTE — ED Notes (Signed)
Pt reports was laying down today around 1200 and started having chest pain.  Reports pain is in upper chest and describes pain as a rock sitting on chest.    Denies any SOB or N/V.

## 2013-10-01 NOTE — ED Provider Notes (Signed)
TIME SEEN: 5:15 PM  CHIEF COMPLAINT: Chest pain  HPI: Patient is a 63 year old female with history of hypertension, hyperlipidemia, tobacco use who presents the emergency department with sharp central chest pain without radiation. She states the pain hurts worse when she palpates her chest and with certain movements. Denies associated shortness of breath, nausea or vomiting, diaphoresis or dizziness. It is not exertional or pleuritic. She was seen here 8/18 for chest pain and was diagnosed with GERD. Denies a history of coronary artery disease. Had a stress test one year ago that was normal per her report. She denies a history of PE or DVT, lower extremity swelling or pain, recent prolonged immobilization, fracture, surgery, trauma, exogenous hormone use. Denies back pain or abdominal pain. No numbness, tingling or focal weakness.  ROS: See HPI Constitutional: no fever  Eyes: no drainage  ENT: no runny nose   Cardiovascular:   chest pain  Resp: no SOB  GI: no vomiting GU: no dysuria Integumentary: no rash  Allergy: no hives  Musculoskeletal: no leg swelling  Neurological: no slurred speech ROS otherwise negative  PAST MEDICAL HISTORY/PAST SURGICAL HISTORY:  Past Medical History  Diagnosis Date  . Hypertension   . Kidney stone   . Hypercholesteremia   . GERD (gastroesophageal reflux disease)     MEDICATIONS:  Prior to Admission medications   Medication Sig Start Date End Date Taking? Authorizing Provider  aspirin EC 81 MG tablet Take 81 mg by mouth daily.   Yes Historical Provider, MD  hydrochlorothiazide (MICROZIDE) 12.5 MG capsule Take 12.5 mg by mouth daily.   Yes Historical Provider, MD  ibuprofen (ADVIL,MOTRIN) 800 MG tablet Take 1 tablet (800 mg total) by mouth every 8 (eight) hours as needed for moderate pain. 12/13/12  Yes Maudry Diego, MD  lisinopril (PRINIVIL,ZESTRIL) 40 MG tablet Take 40 mg by mouth daily.   Yes Historical Provider, MD  pantoprazole (PROTONIX) 20 MG  tablet Take 1 tablet (20 mg total) by mouth daily. 09/18/13  Yes Maudry Diego, MD  pravastatin (PRAVACHOL) 40 MG tablet Take 40 mg by mouth at bedtime.   Yes Historical Provider, MD    ALLERGIES:  No Known Allergies  SOCIAL HISTORY:  History  Substance Use Topics  . Smoking status: Current Every Day Smoker -- 0.25 packs/day    Types: Cigarettes  . Smokeless tobacco: Not on file  . Alcohol Use: No    FAMILY HISTORY: No family history on file.  EXAM: BP 178/74  Pulse 64  Temp(Src) 99.1 F (37.3 C) (Oral)  Resp 18  Ht 5\' 6"  (1.676 m)  Wt 184 lb (83.462 kg)  BMI 29.71 kg/m2  SpO2 56% - error should be 96% on RA CONSTITUTIONAL: Alert and oriented and responds appropriately to questions. Well-appearing; well-nourished, appears comfortable, pleasant, no distress HEAD: Normocephalic EYES: Conjunctivae clear, PERRL ENT: normal nose; no rhinorrhea; moist mucous membranes; pharynx without lesions noted NECK: Supple, no meningismus, no LAD  CARD: RRR; S1 and S2 appreciated; no murmurs, no clicks, no rubs, no gallops RESP: Normal chest excursion without splinting or tachypnea; breath sounds clear and equal bilaterally; no wheezes, no rhonchi, no rales, patient is exquisitely tender to palpation over her anterior chest wall without crepitus or ecchymosis or deformity, no rash, no respiratory distress or hypoxia, speaking full sentences ABD/GI: Normal bowel sounds; non-distended; soft, non-tender, no rebound, no guarding BACK:  The back appears normal and is non-tender to palpation, there is no CVA tenderness EXT: Normal ROM in all joints;  non-tender to palpation; no edema; normal capillary refill; no cyanosis; equal pulses in all extremities    SKIN: Normal color for age and race; warm NEURO: Moves all extremities equally PSYCH: The patient's mood and manner are appropriate. Grooming and personal hygiene are appropriate.  MEDICAL DECISION MAKING: Patient here with very atypical chest  pain I think is chest wall pain. We'll give Toradol, morphine and reassess. Given she does have risk factors for ACS however, will obtain one troponin, chest x-ray. She states her pain started between 11 and 12 today. If his troponin is negative, I feel she can be safely discharged home given this is a 6 hour cardiac marker.  Doubt PE or dissection as pain is very atypical.    ED PROGRESS: Workup has been unremarkable. Troponin negative. I feel she is safe to be discharged home. We'll discharge with prescription for anti-inflammatories and pain medication. Have discussed with her supportive care instructions and return precautions. Have recommend she followup with her primary care physician. Patient verbalizes understanding and is comfortable with plan.     EKG Interpretation  Date/Time:  Monday October 01 2013 15:46:41 EDT Ventricular Rate:  59 PR Interval:  134 QRS Duration: 70 QT Interval:  442 QTC Calculation: 437 R Axis:   -11 Text Interpretation:  Sinus bradycardia Cannot rule out Anterior infarct , age undetermined Abnormal ECG non specific t wave changes No significant change since last tracing Confirmed by KNAPP  MD-J, JON ([id number]) on 10/01/2013 3:59:07 PM         [place], DO 10/01/13 2707

## 2013-10-01 NOTE — ED Notes (Signed)
Pt alert & oriented x4, stable gait. Patient given discharge instructions, paperwork & prescription(s). Patient  instructed to stop at the registration desk to finish any additional paperwork. Patient verbalized understanding. Pt left department w/ no further questions. 

## 2013-10-01 NOTE — ED Notes (Signed)
Pt states the pain in her chest is upper epigastric and is duplicated with palpation. Pt states she was seen in ED recently and DX with GERD and was given RX for same.

## 2013-10-01 NOTE — ED Notes (Signed)
Pt reports was seen here on 8/18 and was diagnosed with GERD.  Pt says this chest pain is worse with movement and is very sharp when rolls over in the bed.

## 2013-10-01 NOTE — Discharge Instructions (Signed)

## 2013-12-11 ENCOUNTER — Emergency Department (HOSPITAL_COMMUNITY)
Admission: EM | Admit: 2013-12-11 | Discharge: 2013-12-11 | Disposition: A | Discharge status: Home / Self Care | Payer: 59 | Attending: Emergency Medicine | Admitting: Emergency Medicine

## 2013-12-11 ENCOUNTER — Emergency Department (HOSPITAL_COMMUNITY): Payer: 59

## 2013-12-11 ENCOUNTER — Encounter (HOSPITAL_COMMUNITY): Payer: Self-pay | Admitting: Emergency Medicine

## 2013-12-11 DIAGNOSIS — Z7982 Long term (current) use of aspirin: Secondary | ICD-10-CM | POA: Diagnosis not present

## 2013-12-11 DIAGNOSIS — Z9889 Other specified postprocedural states: Secondary | ICD-10-CM | POA: Insufficient documentation

## 2013-12-11 DIAGNOSIS — R079 Chest pain, unspecified: Secondary | ICD-10-CM | POA: Diagnosis present

## 2013-12-11 DIAGNOSIS — I10 Essential (primary) hypertension: Secondary | ICD-10-CM | POA: Insufficient documentation

## 2013-12-11 DIAGNOSIS — K219 Gastro-esophageal reflux disease without esophagitis: Secondary | ICD-10-CM | POA: Diagnosis not present

## 2013-12-11 DIAGNOSIS — Z79899 Other long term (current) drug therapy: Secondary | ICD-10-CM | POA: Insufficient documentation

## 2013-12-11 DIAGNOSIS — E780 Pure hypercholesterolemia: Secondary | ICD-10-CM | POA: Diagnosis not present

## 2013-12-11 DIAGNOSIS — Z9089 Acquired absence of other organs: Secondary | ICD-10-CM | POA: Insufficient documentation

## 2013-12-11 DIAGNOSIS — Z87442 Personal history of urinary calculi: Secondary | ICD-10-CM | POA: Diagnosis not present

## 2013-12-11 DIAGNOSIS — Z720 Tobacco use: Secondary | ICD-10-CM | POA: Diagnosis not present

## 2013-12-11 LAB — CBC WITH DIFFERENTIAL/PLATELET
Basophils Absolute: 0 10*3/uL (ref 0.0–0.1)
Basophils Relative: 0 % (ref 0–1)
EOS PCT: 4 % (ref 0–5)
Eosinophils Absolute: 0.2 10*3/uL (ref 0.0–0.7)
HCT: 36.6 % (ref 36.0–46.0)
HEMOGLOBIN: 12.2 g/dL (ref 12.0–15.0)
LYMPHS ABS: 2 10*3/uL (ref 0.7–4.0)
Lymphocytes Relative: 37 % (ref 12–46)
MCH: 30.3 pg (ref 26.0–34.0)
MCHC: 33.3 g/dL (ref 30.0–36.0)
MCV: 91 fL (ref 78.0–100.0)
MONO ABS: 0.3 10*3/uL (ref 0.1–1.0)
MONOS PCT: 5 % (ref 3–12)
NEUTROS PCT: 54 % (ref 43–77)
Neutro Abs: 2.9 10*3/uL (ref 1.7–7.7)
Platelets: 271 10*3/uL (ref 150–400)
RBC: 4.02 MIL/uL (ref 3.87–5.11)
RDW: 12.3 % (ref 11.5–15.5)
WBC: 5.4 10*3/uL (ref 4.0–10.5)

## 2013-12-11 LAB — COMPREHENSIVE METABOLIC PANEL
ALK PHOS: 90 U/L (ref 39–117)
ALT: 12 U/L (ref 0–35)
AST: 13 U/L (ref 0–37)
Albumin: 3.7 g/dL (ref 3.5–5.2)
Anion gap: 12 (ref 5–15)
BILIRUBIN TOTAL: 0.2 mg/dL — AB (ref 0.3–1.2)
BUN: 16 mg/dL (ref 6–23)
CHLORIDE: 105 meq/L (ref 96–112)
CO2: 28 meq/L (ref 19–32)
Calcium: 9.5 mg/dL (ref 8.4–10.5)
Creatinine, Ser: 0.94 mg/dL (ref 0.50–1.10)
GFR calc Af Amer: 74 mL/min — ABNORMAL LOW (ref >90)
GFR, EST NON AFRICAN AMERICAN: 64 mL/min — AB (ref >90)
Glucose, Bld: 101 mg/dL — ABNORMAL HIGH (ref 70–99)
Potassium: 4.2 mEq/L (ref 3.7–5.3)
SODIUM: 145 meq/L (ref 137–147)
Total Protein: 7.4 g/dL (ref 6.0–8.3)

## 2013-12-11 LAB — TROPONIN I: Troponin I: <0.3 ng/mL (ref <0.30)

## 2013-12-11 LAB — LIPASE, BLOOD: Lipase: 57 U/L (ref 11–59)

## 2013-12-11 MED ORDER — HYDROCODONE-ACETAMINOPHEN 5-325 MG PO TABS: 1.0000 | ORAL_TABLET | Freq: Four times a day (QID) | ORAL | Status: DC | PRN

## 2013-12-11 MED ORDER — GI COCKTAIL ~~LOC~~
30.0000 mL | Freq: Once | ORAL | Status: AC
  Administered 2013-12-11: 30 mL via ORAL
  Filled 2013-12-11: qty 30

## 2013-12-11 NOTE — ED Notes (Signed)
Patient states recurrent chest pain, upper chest, mid. Burning and pulling sensation.

## 2013-12-11 NOTE — Discharge Instructions (Signed)
Gastroesophageal Reflux Disease, Adult °Gastroesophageal reflux disease (GERD) happens when acid from your stomach goes into your food pipe (esophagus). The acid can cause a burning feeling in your chest. Over time, the acid can make small holes (ulcers) in your food pipe.  °HOME CARE °· Ask your doctor for advice about: °¨ Losing weight. °¨ Quitting smoking. °¨ Alcohol use. °· Avoid foods and drinks that make your problems worse. You may want to avoid: °¨ Caffeine and alcohol. °¨ Chocolate. °¨ Mints. °¨ Garlic and onions. °¨ Spicy foods. °¨ Citrus fruits, such as oranges, lemons, or limes. °¨ Foods that contain tomato, such as sauce, chili, salsa, and pizza. °¨ Fried and fatty foods. °· Avoid lying down for 3 hours before you go to bed or before you take a nap. °· Eat small meals often, instead of large meals. °· Wear loose-fitting clothing. Do not wear anything tight around your waist. °· Raise (elevate) the head of your bed 6 to 8 inches with wood blocks. Using extra pillows does not help. °· Only take medicines as told by your doctor. °· Do not take aspirin or ibuprofen. °GET HELP RIGHT AWAY IF:  °· You have pain in your arms, neck, jaw, teeth, or back. °· Your pain gets worse or changes. °· You feel sick to your stomach (nauseous), throw up (vomit), or sweat (diaphoresis). °· You feel short of breath, or you pass out (faint). °· Your throw up is green, yellow, black, or looks like coffee grounds or blood. °· Your poop (stool) is red, bloody, or black. °MAKE SURE YOU:  °· Understand these instructions. °· Will watch your condition. °· Will get help right away if you are not doing well or get worse. °Document Released: 07/07/2007 Document Revised: 04/12/2011 Document Reviewed: 08/07/2010 °Exit[employer]® Patient Information ©2015 [employer]. This information is not intended to replace advice given to you by your health care provider. Make sure you discuss any questions you have with your health care provider. ° °

## 2013-12-11 NOTE — ED Provider Notes (Signed)
CSN: [id number]     Arrival date & time 12/11/13  3532 History   First MD Initiated Contact with Patient 12/11/13 [phone]     Chief Complaint  Patient presents with  . Chest Pain     (Consider location/radiation/quality/duration/timing/severity/associated sxs/prior Treatment) HPI Comments: Pt comes in today with recurrent burning and pulling in her upper chest and abdomen. Pt states that she has been seen multiple times both by the er and her pcp and she has been told that it is gerd. She states that she is taking the gerd medication and not getting any relief. She states that it seems to come on after she has been lifting heavy thing. She doesn't have diaphoresis, n/v,cough or fever. She states the only thing that helps is oxycodone. Pt states that this episode started a couple of days ago. Pt states that her pain is 7/10 at this time  The history is provided by the patient. No language interpreter was used.    Past Medical History  Diagnosis Date  . Hypertension   . Kidney stone   . Hypercholesteremia   . GERD (gastroesophageal reflux disease)    Past Surgical History  Procedure Laterality Date  . Abdominal hysterectomy    . Kidney stone surgery    . Tumor of ovary removed     No family history on file. History  Substance Use Topics  . Smoking status: Current Every Day Smoker -- 0.25 packs/day    Types: Cigarettes  . Smokeless tobacco: Not on file  . Alcohol Use: No   OB History    No data available     Review of Systems  All other systems reviewed and are negative.     Allergies  Review of patient's allergies indicates no known allergies.  Home Medications   Prior to Admission medications   Medication Sig Start Date End Date Taking? Authorizing Provider  aspirin EC 81 MG tablet Take 81 mg by mouth daily.   Yes Historical Provider, MD  hydrochlorothiazide (MICROZIDE) 12.5 MG capsule Take 12.5 mg by mouth daily.   Yes Historical Provider, MD  lisinopril  (PRINIVIL,ZESTRIL) 40 MG tablet Take 40 mg by mouth daily.   Yes Historical Provider, MD  pantoprazole (PROTONIX) 20 MG tablet Take 1 tablet (20 mg total) by mouth daily. 09/18/13  Yes Maudry Diego, MD  pravastatin (PRAVACHOL) 40 MG tablet Take 40 mg by mouth at bedtime.   Yes Historical Provider, MD  HYDROcodone-acetaminophen (NORCO/VICODIN) 5-325 MG per tablet Take 1 tablet by mouth every 4 (four) hours as needed. Patient not taking: Reported on 12/11/2013 10/01/13   Kristen N Ward, DO  ibuprofen (ADVIL,MOTRIN) 800 MG tablet Take 1 tablet (800 mg total) by mouth every 8 (eight) hours as needed for moderate pain. Patient not taking: Reported on 12/11/2013 12/13/12   Maudry Diego, MD  ibuprofen (ADVIL,MOTRIN) 800 MG tablet Take 1 tablet (800 mg total) by mouth every 8 (eight) hours as needed for moderate pain. Patient not taking: Reported on 12/11/2013 10/01/13   Kristen N Ward, DO   BP 173/84 mmHg  Pulse 65  Temp(Src) 98.3 F (36.8 C) (Oral)  Resp 16  Ht 5\' 5"  (1.651 m)  Wt 180 lb (81.647 kg)  BMI 29.95 kg/m2  SpO2 96% Physical Exam  Constitutional: She is oriented to person, place, and time. She appears well-developed and well-nourished.  HENT:  Head: Normocephalic and atraumatic.  Eyes: Conjunctivae and EOM are normal. Pupils are equal, round, and reactive to light.  Cardiovascular: Normal rate and regular rhythm.   Pulmonary/Chest: Effort normal and breath sounds normal. She exhibits no tenderness.  Abdominal: Soft. Bowel sounds are normal.  Musculoskeletal: Normal range of motion.  Neurological: She is alert and oriented to person, place, and time.  Skin: Skin is warm and dry.  Psychiatric: She has a normal mood and affect.  Nursing note and vitals reviewed.   ED Course  Procedures (including critical care time) Labs Review Labs Reviewed  COMPREHENSIVE METABOLIC PANEL - Abnormal; Notable for the following:    Glucose, Bld 101 (*)    Total Bilirubin 0.2 (*)    GFR calc  non Af Amer 64 (*)    GFR calc Af Amer 74 (*)    All other components within normal limits  CBC WITH DIFFERENTIAL  LIPASE, BLOOD  TROPONIN I    Imaging Review Dg Chest 2 View  12/11/2013   CLINICAL DATA:  Chest pain for 1 month, more severe today  EXAM: CHEST  2 VIEW  COMPARISON:  October 01, 2013  FINDINGS: There is no edema or consolidation. Heart is upper normal in size with pulmonary vascularity within normal limits. No adenopathy. No pneumothorax. No bone lesions.  IMPRESSION: No edema or consolidation.   Electronically Signed   By: Lowella Grip M.D.   On: 12/11/2013 10:28     EKG Interpretation   Date/Time:  Tuesday December 11 2013 09:36:16 EST Ventricular Rate:  61 PR Interval:  143 QRS Duration: 73 QT Interval:  474 QTC Calculation: 477 R Axis:   12 Text Interpretation:  Sinus rhythm Low voltage, precordial leads  Borderline repolarization abnormality No significant change was found  Confirmed by CAMPOS  MD, KEVIN ([id number]) on 12/11/2013 11:29:16 AM      MDM   Final diagnoses:  Chest pain  Gastroesophageal reflux disease, esophagitis presence not specified    Pt pain completely resolved with gi cocktail. Discussed with pt follow up with her pcp and potentially gi. Think this is more gi in nature then cardiac. As pain has been going for 2  Days think one trop is suficient and this time. Pt is okay with plan. Requesting pain medications for home    Glendell Docker, NP 12/11/13 [place], MD 12/11/13 [phone]

## 2013-12-11 NOTE — ED Notes (Signed)
Patient denies chest pain at this time 

## 2013-12-24 ENCOUNTER — Ambulatory Visit (INDEPENDENT_AMBULATORY_CARE_PROVIDER_SITE_OTHER): Payer: 59 | Admitting: Internal Medicine

## 2013-12-24 ENCOUNTER — Encounter: Payer: Self-pay | Admitting: Internal Medicine

## 2013-12-24 VITALS — BP 108/78 | HR 83 | Ht 66.0 in | Wt 177.0 lb

## 2013-12-24 DIAGNOSIS — I10 Essential (primary) hypertension: Secondary | ICD-10-CM

## 2013-12-24 DIAGNOSIS — E785 Hyperlipidemia, unspecified: Secondary | ICD-10-CM

## 2013-12-24 DIAGNOSIS — R0789 Other chest pain: Secondary | ICD-10-CM

## 2013-12-24 NOTE — Patient Instructions (Signed)
Your physician recommends that you schedule a follow-up appointment in: only if needed    Thank you for choosing [facility] !         

## 2013-12-24 NOTE — Progress Notes (Signed)
HPI  Patient is a 63 yo who is referred for eval of CP [place] has no known history of CAD  Was seen in ER for CP in AUgust  She thinks that is when it started.  Denies injury. Pain at time felt musculoskeletal in origin.  Some exacerbation with breathing and also movement of upper body SInce August she has continued to have  Pain can be constant   She does think it is getting some better  Seen in ER in November for CP  Got better with GI cocktail    Patient walks  No problems with walking   No Known Allergies  Current Outpatient Prescriptions  Medication Sig Dispense Refill  . aspirin EC 81 MG tablet Take 81 mg by mouth daily.    . hydrochlorothiazide (MICROZIDE) 12.5 MG capsule Take 12.5 mg by mouth daily.    Marland Kitchen HYDROcodone-acetaminophen (NORCO/VICODIN) 5-325 MG per tablet Take 1-2 tablets by mouth every 6 (six) hours as needed. 10 tablet 0  . lisinopril (PRINIVIL,ZESTRIL) 40 MG tablet Take 40 mg by mouth daily.    . pantoprazole (PROTONIX) 20 MG tablet Take 1 tablet (20 mg total) by mouth daily. 30 tablet 0  . pravastatin (PRAVACHOL) 40 MG tablet Take 40 mg by mouth at bedtime.     No current facility-administered medications for this visit.    Past Medical History  Diagnosis Date  . Hypertension   . Kidney stone   . Hypercholesteremia   . GERD (gastroesophageal reflux disease)     Past Surgical History  Procedure Laterality Date  . Abdominal hysterectomy    . Kidney stone surgery    . Tumor of ovary removed      No family history on file.  Father with history of CAD  MI in 77s    History   Social History  . Marital Status: Married    Spouse Name: N/A    Number of Children: N/A  . Years of Education: N/A   Occupational History  . Not on file.   Social History Main Topics  . Smoking status: Current Every Day Smoker -- 0.25 packs/day    Types: Cigarettes  . Smokeless tobacco: Not on file  . Alcohol Use: No  . Drug Use: No  . Sexual Activity: Yes    Birth  Control/ Protection: Surgical   Other Topics Concern  . Not on file   Social History Narrative    Review of Systems:  All systems reviewed.  They are negative to the above problem except as previously stated.  Vital Signs: BP 108/78 mmHg  Pulse 83  Ht 5\' 6"  (1.676 m)  Wt 177 lb (80.287 kg)  BMI 28.58 kg/m2  Physical Exam  HEENT:  Normocephalic, atraumatic. EOMI, PERRLA.  Neck: JVP is normal.  No bruits.  Lungs: clear to auscultation. No rales no wheezes.  Chest:  Mild tenderness upper sternal area   Heart: Regular rate and rhythm. Normal S1, S2. No S3.   No significant murmurs. PMI not displaced.  Abdomen:  Supple, nontender. Normal bowel sounds. No masses. No hepatomegaly.  Extremities:   Good distal pulses throughout. No lower extremity edema.  Musculoskeletal :moving all extremities.  Neuro:   alert and oriented x3.  CN II-XII grossly intact.  EKG  12/12/13:  SR  Nonspecific ST T wave changes    Assessment and Plan:  1.  CP  Atypical  I do not think it is cardiac in origin  Probable musculoskeletal  She has  had constantly  Seems to be getting better Take motrin as needed  Rest  2.  HTN  Good control  3  HL  WIll get lipids from primary care  4.  Tob  Congratulated on slowing down  Urged her ton continue  Stay active  F/U as needed.

## 2014-01-31 ENCOUNTER — Telehealth: Payer: Self-pay

## 2014-01-31 ENCOUNTER — Other Ambulatory Visit: Payer: Self-pay

## 2014-01-31 DIAGNOSIS — E785 Hyperlipidemia, unspecified: Secondary | ICD-10-CM

## 2014-01-31 MED ORDER — ROSUVASTATIN CALCIUM 20 MG PO TABS: 20.0000 mg | ORAL_TABLET | Freq: Every day | ORAL | Status: AC

## 2014-01-31 NOTE — Telephone Encounter (Signed)
Home number just rings,both work and sister's number arte incorrect.[place] and asked to verify her phone number with them.They state they will call patient and give her our number and ask her to call me  We received labs from their office drawn in April 2015 which had severely elevated LDL and Dr Harrington Challenger wants to find out if patient taking Pravachol and has instructions

## 2014-01-31 NOTE — Telephone Encounter (Signed)
Pt called back,verified that her home and work number are as what we have in computer. She reports that she is taking Pravachol including when labs were done in April.Per Dr.Ross,patient is to stop pravachol and start Crestor 20 mg daily.Repeat lipid and AST in 8 weeks,lab slip mailed to pt.E-scribed crestor to [facility] on [address]

## 2014-12-12 ENCOUNTER — Telehealth: Payer: Self-pay

## 2014-12-12 NOTE — Telephone Encounter (Signed)
[facility] called to follow up on a triage referral. I told them a letter had been mailed out on 09/04/2014 and you were waiting to hear back from patient.

## 2014-12-19 NOTE — Telephone Encounter (Signed)
Tried to call. Many rings and no answer.  

## 2014-12-25 NOTE — Telephone Encounter (Signed)
Called.Many rings and no answer. Mailing another letter to pt and sending letter to PCP.

## 2015-03-30 ENCOUNTER — Emergency Department (HOSPITAL_COMMUNITY): Payer: 59

## 2015-03-30 ENCOUNTER — Encounter (HOSPITAL_COMMUNITY): Payer: Self-pay | Admitting: *Deleted

## 2015-03-30 ENCOUNTER — Emergency Department (HOSPITAL_COMMUNITY)
Admission: EM | Admit: 2015-03-30 | Discharge: 2015-03-30 | Disposition: A | Discharge status: Home / Self Care | Payer: 59 | Attending: Emergency Medicine | Admitting: Emergency Medicine

## 2015-03-30 DIAGNOSIS — L03115 Cellulitis of right lower limb: Secondary | ICD-10-CM | POA: Diagnosis not present

## 2015-03-30 DIAGNOSIS — I10 Essential (primary) hypertension: Secondary | ICD-10-CM | POA: Diagnosis not present

## 2015-03-30 DIAGNOSIS — M79671 Pain in right foot: Secondary | ICD-10-CM | POA: Diagnosis present

## 2015-03-30 DIAGNOSIS — Z7982 Long term (current) use of aspirin: Secondary | ICD-10-CM | POA: Insufficient documentation

## 2015-03-30 DIAGNOSIS — F17210 Nicotine dependence, cigarettes, uncomplicated: Secondary | ICD-10-CM | POA: Insufficient documentation

## 2015-03-30 DIAGNOSIS — L539 Erythematous condition, unspecified: Secondary | ICD-10-CM | POA: Diagnosis not present

## 2015-03-30 DIAGNOSIS — K219 Gastro-esophageal reflux disease without esophagitis: Secondary | ICD-10-CM | POA: Diagnosis not present

## 2015-03-30 DIAGNOSIS — Z87442 Personal history of urinary calculi: Secondary | ICD-10-CM | POA: Insufficient documentation

## 2015-03-30 DIAGNOSIS — E7800 Pure hypercholesterolemia, unspecified: Secondary | ICD-10-CM | POA: Insufficient documentation

## 2015-03-30 MED ORDER — SULFAMETHOXAZOLE-TRIMETHOPRIM 800-160 MG PO TABS: 1.0000 | ORAL_TABLET | Freq: Two times a day (BID) | ORAL | Status: AC

## 2015-03-30 MED ORDER — SULFAMETHOXAZOLE-TRIMETHOPRIM 800-160 MG PO TABS
1.0000 | ORAL_TABLET | Freq: Once | ORAL | Status: AC
  Administered 2015-03-30: 1 via ORAL
  Filled 2015-03-30: qty 1

## 2015-03-30 NOTE — ED Notes (Addendum)
Right foot pain with small area of redness and swelling to lateral side of foot. Pt denies any recent injury. Pt states she forgot to take her BP med this morning. 150/109

## 2015-03-30 NOTE — ED Provider Notes (Signed)
CSN: [id number]     Arrival date & time 03/30/15  1205 History   First MD Initiated Contact with Patient 03/30/15 1310     Chief Complaint  Patient presents with  . Foot Pain     (Consider location/radiation/quality/duration/timing/severity/associated sxs/prior Treatment) HPI..... Pain and redness on lateral aspect of right foot for 24 hours. No fever, sweats, chills. Patient is not diabetic. No known trauma or new injury. Severity is mild.  Past Medical History  Diagnosis Date  . Hypertension   . Kidney stone   . Hypercholesteremia   . GERD (gastroesophageal reflux disease)    Past Surgical History  Procedure Laterality Date  . Abdominal hysterectomy    . Kidney stone surgery    . Tumor of ovary removed     No family history on file. Social History  Substance Use Topics  . Smoking status: Current Every Day Smoker -- 0.25 packs/day    Types: Cigarettes  . Smokeless tobacco: None  . Alcohol Use: No   OB History    No data available     Review of Systems  All other systems reviewed and are negative.     Allergies  Review of patient's allergies indicates no known allergies.  Home Medications   Prior to Admission medications   Medication Sig Start Date End Date Taking? Authorizing Provider  aspirin EC 81 MG tablet Take 81 mg by mouth daily.   Yes Historical Provider, MD  hydrochlorothiazide (HYDRODIURIL) 25 MG tablet Take 1 tablet by mouth daily. 02/13/15  Yes Historical Provider, MD  lisinopril (PRINIVIL,ZESTRIL) 40 MG tablet Take 40 mg by mouth daily.   Yes Historical Provider, MD  pantoprazole (PROTONIX) 40 MG tablet Take 1 tablet by mouth daily. 03/28/15  Yes Historical Provider, MD  rosuvastatin (CRESTOR) 20 MG tablet Take 1 tablet (20 mg total) by mouth daily. 01/31/14  Yes Fay Records, MD  sulfamethoxazole-trimethoprim (BACTRIM DS,SEPTRA DS) 800-160 MG tablet Take 1 tablet by mouth 2 (two) times daily. 03/30/15 04/06/15  Nat Christen, MD   BP 150/109 mmHg  Pulse  59  Temp(Src) 98.9 F (37.2 C) (Oral)  Resp 16  Ht 5\' 6"  (1.676 m)  Wt 177 lb (80.287 kg)  BMI 28.58 kg/m2  SpO2 100% Physical Exam  Constitutional: She is oriented to person, place, and time. She appears well-developed and well-nourished.  HENT:  Head: Normocephalic and atraumatic.  Eyes: Conjunctivae and EOM are normal. Pupils are equal, round, and reactive to light.  Neck: Normal range of motion. Neck supple.  Musculoskeletal: Normal range of motion.  Right foot: Area of erythema approximately 8 x 4 cm on lateral aspect of right foot.  Neurological: She is alert and oriented to person, place, and time.  Skin: Skin is warm and dry.  Psychiatric: She has a normal mood and affect. Her behavior is normal.  Nursing note and vitals reviewed.   ED Course  Procedures (including critical care time) Labs Review Labs Reviewed - No data to display  Imaging Review Dg Foot Complete Right  03/30/2015  CLINICAL DATA:  65 year old female with acute right foot pain, redness and swelling. EXAM: RIGHT FOOT COMPLETE - 3+ VIEW COMPARISON:  None. FINDINGS: There is no evidence of fracture, subluxation or dislocation. The Lisfranc joints are unremarkable. A small calcaneal spur is present. No other focal bony abnormalities are noted. IMPRESSION: No acute bony abnormalities. Small calcaneal spur. Electronically Signed   By: Margarette Canada M.D.   On: 03/30/2015 13:52   I have  personally reviewed and evaluated these images and lab results as part of my medical decision-making.   EKG Interpretation None      MDM   Final diagnoses:  Cellulitis of right foot    Plain films of right foot negative for fracture. Suspect early cellulitis. Rx Septra DS twice a day.    Nat Christen, MD 03/30/15 1430

## 2015-03-30 NOTE — Discharge Instructions (Signed)
Cellulitis Cellulitis is an infection of the skin and the tissue under the skin. The infected area is usually red and tender. This happens most often in the arms and lower legs. HOME CARE   Take your antibiotic medicine as told. Finish the medicine even if you start to feel better.  Keep the infected arm or leg raised (elevated).  Put a warm cloth on the area up to 4 times per day.  Only take medicines as told by your doctor.  Keep all doctor visits as told. GET HELP IF:  You see red streaks on the skin coming from the infected area.  Your red area gets bigger or turns a dark color.  Your bone or joint under the infected area is painful after the skin heals.  Your infection comes back in the same area or different area.  You have a puffy (swollen) bump in the infected area.  You have new symptoms.  You have a fever. GET HELP RIGHT AWAY IF:   You feel very sleepy.  You throw up (vomit) or have watery poop (diarrhea).  You feel sick and have muscle aches and pains.   This information is not intended to replace advice given to you by your health care provider. Make sure you discuss any questions you have with your health care provider.   Document Released: 07/07/2007 Document Revised: 10/09/2014 Document Reviewed: 04/05/2011 Elsevier Interactive Patient Education 2016 [place] of foot was normal. You have a small amount of skin infection called cellulitis. Prescription for antibiotic. Elevate foot. Keep very clean. Return if worse.

## 2015-11-06 ENCOUNTER — Other Ambulatory Visit (HOSPITAL_COMMUNITY): Payer: Self-pay | Admitting: Nephrology

## 2015-11-06 DIAGNOSIS — N183 Chronic kidney disease, stage 3 unspecified: Secondary | ICD-10-CM

## 2015-11-25 ENCOUNTER — Ambulatory Visit (HOSPITAL_COMMUNITY)
Admission: RE | Admit: 2015-11-25 | Discharge: 2015-11-25 | Disposition: A | Discharge status: Home / Self Care | Payer: 59 | Source: Ambulatory Visit | Attending: Nephrology | Admitting: Nephrology

## 2015-11-25 DIAGNOSIS — N261 Atrophy of kidney (terminal): Secondary | ICD-10-CM | POA: Diagnosis not present

## 2015-11-25 DIAGNOSIS — N183 Chronic kidney disease, stage 3 unspecified: Secondary | ICD-10-CM

## 2017-05-19 ENCOUNTER — Encounter: Payer: Self-pay | Admitting: Gastroenterology

## 2017-06-07 ENCOUNTER — Other Ambulatory Visit: Payer: Self-pay

## 2017-06-07 ENCOUNTER — Emergency Department (HOSPITAL_COMMUNITY)
Admission: EM | Admit: 2017-06-07 | Discharge: 2017-06-07 | Disposition: A | Discharge status: Home / Self Care | Payer: 59 | Attending: Emergency Medicine | Admitting: Emergency Medicine

## 2017-06-07 ENCOUNTER — Encounter (HOSPITAL_COMMUNITY): Payer: Self-pay | Admitting: Emergency Medicine

## 2017-06-07 DIAGNOSIS — I10 Essential (primary) hypertension: Secondary | ICD-10-CM | POA: Diagnosis not present

## 2017-06-07 DIAGNOSIS — F17210 Nicotine dependence, cigarettes, uncomplicated: Secondary | ICD-10-CM | POA: Diagnosis not present

## 2017-06-07 DIAGNOSIS — Z7982 Long term (current) use of aspirin: Secondary | ICD-10-CM | POA: Insufficient documentation

## 2017-06-07 DIAGNOSIS — M791 Myalgia, unspecified site: Secondary | ICD-10-CM | POA: Diagnosis present

## 2017-06-07 DIAGNOSIS — B349 Viral infection, unspecified: Secondary | ICD-10-CM | POA: Diagnosis not present

## 2017-06-07 DIAGNOSIS — R001 Bradycardia, unspecified: Secondary | ICD-10-CM | POA: Insufficient documentation

## 2017-06-07 DIAGNOSIS — N289 Disorder of kidney and ureter, unspecified: Secondary | ICD-10-CM | POA: Diagnosis not present

## 2017-06-07 DIAGNOSIS — Z79899 Other long term (current) drug therapy: Secondary | ICD-10-CM | POA: Insufficient documentation

## 2017-06-07 LAB — COMPREHENSIVE METABOLIC PANEL
ALT: 19 U/L (ref 14–54)
AST: 15 U/L (ref 15–41)
Albumin: 3.8 g/dL (ref 3.5–5.0)
Alkaline Phosphatase: 66 U/L (ref 38–126)
Anion gap: 7 (ref 5–15)
BUN: 32 mg/dL — AB (ref 6–20)
CHLORIDE: 106 mmol/L (ref 101–111)
CO2: 29 mmol/L (ref 22–32)
Calcium: 9.6 mg/dL (ref 8.9–10.3)
Creatinine, Ser: 1.68 mg/dL — ABNORMAL HIGH (ref 0.44–1.00)
GFR calc Af Amer: 36 mL/min — ABNORMAL LOW (ref >60)
GFR calc non Af Amer: 31 mL/min — ABNORMAL LOW (ref >60)
GLUCOSE: 105 mg/dL — AB (ref 65–99)
Potassium: 4 mmol/L (ref 3.5–5.1)
Sodium: 142 mmol/L (ref 135–145)
Total Bilirubin: 0.5 mg/dL (ref 0.3–1.2)
Total Protein: 7.1 g/dL (ref 6.5–8.1)

## 2017-06-07 LAB — CBC WITH DIFFERENTIAL/PLATELET
BASOS ABS: 0 10*3/uL (ref 0.0–0.1)
Basophils Relative: 0 %
EOS PCT: 2 %
Eosinophils Absolute: 0.2 10*3/uL (ref 0.0–0.7)
HCT: 36.1 % (ref 36.0–46.0)
Hemoglobin: 11.8 g/dL — ABNORMAL LOW (ref 12.0–15.0)
Lymphocytes Relative: 37 %
Lymphs Abs: 2.7 10*3/uL (ref 0.7–4.0)
MCH: 29.9 pg (ref 26.0–34.0)
MCHC: 32.7 g/dL (ref 30.0–36.0)
MCV: 91.6 fL (ref 78.0–100.0)
Monocytes Absolute: 0.6 10*3/uL (ref 0.1–1.0)
Monocytes Relative: 8 %
Neutro Abs: 3.9 10*3/uL (ref 1.7–7.7)
Neutrophils Relative %: 53 %
PLATELETS: 252 10*3/uL (ref 150–400)
RBC: 3.94 MIL/uL (ref 3.87–5.11)
RDW: 12.9 % (ref 11.5–15.5)
WBC: 7.4 10*3/uL (ref 4.0–10.5)

## 2017-06-07 LAB — TROPONIN I

## 2017-06-07 MED ORDER — SODIUM CHLORIDE 0.9 % IV BOLUS
1000.0000 mL | Freq: Once | INTRAVENOUS | Status: AC
  Administered 2017-06-07: 1000 mL via INTRAVENOUS

## 2017-06-07 NOTE — ED Triage Notes (Signed)
Onset Friday, fever, chills, body aches, no appetite, feels really tired, shoulder and neck pain, denies vomiting or diarrhea

## 2017-06-07 NOTE — ED Provider Notes (Signed)
[facility] EMERGENCY DEPARTMENT Provider Note   CSN: [id number] Arrival date & time: 06/07/17  5462     History   Chief Complaint Chief Complaint  Patient presents with  . Generalized Body Aches    HPI Nancy Woodward is a 67 y.o. female.  Pt complains of feeling dizzy.  Pt reports she recently was treated for vertigo.   Pt reports she was exposed to a Art therapist who had a virus.    The history is provided by the patient. No language interpreter was used.  Weakness  Primary symptoms include dizziness. The current episode started more than 2 days ago. The problem has not changed since onset.There was no focality noted. There has been no fever. Pertinent negatives include no shortness of breath, no chest pain and no vomiting. There were no medications administered prior to arrival. Associated medical issues do not include trauma.   Pt reports she has had increased renal functions.   Pt reports her Md has been monitoring.  Past Medical History:  Diagnosis Date  . GERD (gastroesophageal reflux disease)   . Hypercholesteremia   . Hypertension   . Kidney stone     There are no active problems to display for this patient.   Past Surgical History:  Procedure Laterality Date  . ABDOMINAL HYSTERECTOMY    . KIDNEY STONE SURGERY    . tumor of ovary removed       OB History   None      Home Medications    Prior to Admission medications   Medication Sig Start Date End Date Taking? Authorizing Provider  aspirin EC 81 MG tablet Take 81 mg by mouth daily.   Yes [provider]  hydrochlorothiazide (HYDRODIURIL) 25 MG tablet Take 1 tablet by mouth daily. 02/13/15  Yes [provider]  lisinopril (PRINIVIL,ZESTRIL) 40 MG tablet Take 40 mg by mouth daily.   Yes [provider]  pantoprazole (PROTONIX) 40 MG tablet Take 1 tablet by mouth daily. 03/28/15  Yes [provider]  rosuvastatin (CRESTOR) 20 MG tablet Take 1 tablet (20 mg total) by  mouth daily. 01/31/14  Yes Fay Records, MD    Family History No family history on file.  Social History Social History   Tobacco Use  . Smoking status: Current Every Day Smoker    Packs/day: 0.25    Types: Cigarettes  . Smokeless tobacco: Never Used  Substance Use Topics  . Alcohol use: No  . Drug use: No     Allergies   Augmentin [amoxicillin-pot clavulanate]   Review of Systems Review of Systems  Respiratory: Negative for shortness of breath.   Cardiovascular: Negative for chest pain.  Gastrointestinal: Negative for vomiting.  Neurological: Positive for dizziness and weakness.  All other systems reviewed and are negative.    Physical Exam Updated Vital Signs BP 113/66 (BP Location: Right Arm)   Pulse (!) 50   Temp 98.1 F (36.7 C) (Oral)   Resp 19   Ht 5\' 6"  (1.676 m)   Wt 77.1 kg (170 lb)   SpO2 100%   BMI 27.44 kg/m   Physical Exam  Constitutional: She appears well-developed.  HENT:  Head: Normocephalic.  Right Ear: External ear normal.  Left Ear: External ear normal.  Nose: Nose normal.  Mouth/Throat: Oropharynx is clear and moist.  Eyes: Pupils are equal, round, and reactive to light. Conjunctivae and EOM are normal.  Neck: Normal range of motion.  Cardiovascular: Normal rate and regular rhythm.  Pulmonary/Chest: Effort normal.  Abdominal: Soft.  Musculoskeletal: Normal range of motion.  Neurological: She is alert.  Skin: Skin is warm.  Psychiatric: She has a normal mood and affect.  Nursing note and vitals reviewed.    ED Treatments / Results  Labs (all labs ordered are listed, but only abnormal results are displayed) Labs Reviewed  CBC WITH DIFFERENTIAL/PLATELET - Abnormal; Notable for the following components:      Result Value   Hemoglobin 11.8 (*)    All other components within normal limits  COMPREHENSIVE METABOLIC PANEL - Abnormal; Notable for the following components:   Glucose, Bld 105 (*)    BUN 32 (*)    Creatinine, Ser  1.68 (*)    GFR calc non Af Amer 31 (*)    GFR calc Af Amer 36 (*)    All other components within normal limits  TROPONIN I    EKG None  Radiology No results found.  Procedures Procedures (including critical care time)  Medications Ordered in ED Medications  sodium chloride 0.9 % bolus 1,000 mL (1,000 mLs Intravenous New Bag/Given 06/07/17 1201)     Initial Impression / Assessment and Plan / ED Course  I have reviewed the triage vital signs and the nursing notes.  Pertinent labs & imaging results that were available during my care of the patient were reviewed by me and considered in my medical decision making (see chart for details).     Pt given Iv fluids x 1 liter. Pt counseled on results. Pt  Will follow up with her MD for recheck on renal function.   Pt given Iv fluids.    Final Clinical Impressions(s) / ED Diagnoses   Final diagnoses:  Viral syndrome  Renal insufficiency  Bradycardia    ED Discharge Orders    None    An After Visit Summary was printed and given to the patient.    Fransico Meadow, [place] 06/07/17 1243    Margette Fast, MD 06/07/17 [phone]

## 2017-06-07 NOTE — Discharge Instructions (Signed)
See your Physician for recheck in 1 week  °

## 2017-06-07 NOTE — ED Triage Notes (Signed)
Complains of clear nasal drainage.

## 2017-06-23 ENCOUNTER — Ambulatory Visit (INDEPENDENT_AMBULATORY_CARE_PROVIDER_SITE_OTHER): Payer: Self-pay

## 2017-06-23 DIAGNOSIS — Z1211 Encounter for screening for malignant neoplasm of colon: Secondary | ICD-10-CM

## 2017-06-23 MED ORDER — NA SULFATE-K SULFATE-MG SULF 17.5-3.13-1.6 GM/177ML PO SOLN: 1.0000 | ORAL | 0 refills | Status: DC

## 2017-06-23 NOTE — Patient Instructions (Addendum)
Nancy Woodward   February 19, 1950 MRN: [id number]    Procedure Date: 08/22/17 Time to register: 8:30am Place to register: [facility] Procedure Time: 9:30am Scheduled provider: Barney Drain, MD  PREPARATION FOR COLONOSCOPY WITH TRI-LYTE SPLIT PREP  Please notify us immediately if you are diabetic, take iron supplements, or if you are on Coumadin or any other blood thinners.     You will need to purchase 1 fleet enema and 1 box of Bisacodyl 70m tablets.    1 DAY BEFORE PROCEDURE:  DATE: 08/21/17   DAY: Sunday  clear liquids the entire day - NO SOLID FOOD.   Diabetic medications adjustments for today: Metformin-  take half a tablet in the AM and PM  At 2:00 pm:  Take 2 Bisacodyl tablets.   At 4:00pm:  Start drinking your solution. Make sure you mix well per instructions on the bottle. Try to drink 1 (one) 8 ounce glass every 10-15 minutes until you have consumed HALF the jug. You should complete by 6:00pm.You must keep the left over solution refrigerated until completed next day.  Continue clear liquids. You must drink plenty of clear liquids to prevent dehyration and kidney failure.  If you take medications for your heart, blood pressure or breathing, you may take these medications with a clear liquid.    DAY OF PROCEDURE:   DATE: 08/22/17   DAY: Monday  Diabetic medications adjustments for today: Metformin- do not take any this AM.   Five hours before your procedure time @ 4:30am:  Finish remaining amout of bowel prep, drinking 1 (one) 8 ounce glass every 10-15 minutes until complete. You have two hours to consume remaining prep.   Three hours before your procedure time _0 :30am:  Nothing by mouth.   At least one hour before going to the hospital:  Give yourself one Fleet enema. You may take your morning medications with sip of water unless we have instructed otherwise.      Please see below for Dietary Information.  CLEAR LIQUIDS INCLUDE:  Water Jello (NOT red in  color)   Ice Popsicles (NOT red in color)   Tea (sugar ok, no milk/cream) Powdered fruit flavored drinks  Coffee (sugar ok, no milk/cream) Gatorade/ Lemonade/ Kool-Aid  (NOT red in color)   Juice: apple, white grape, white cranberry Soft drinks  Clear bullion, consomme, broth (fat free beef/chicken/vegetable)  Carbonated beverages (any kind)  Strained chicken noodle soup Hard Candy   Remember: Clear liquids are liquids that will allow you to see your fingers on the other side of a clear glass. Be sure liquids are NOT red in color, and not cloudy, but CLEAR.  DO NOT EAT OR DRINK ANY OF THE FOLLOWING:  Dairy products of any kind   Cranberry juice Tomato juice / V8 juice   Grapefruit juice Orange juice     Red grape juice  Do not eat any solid foods, including such foods as: cereal, oatmeal, yogurt, fruits, vegetables, creamed soups, eggs, bread, crackers, pureed foods in a blender, etc.   HELPFUL HINTS FOR DRINKING PREP SOLUTION:   Make sure prep is extremely cold. Mix and refrigerate the the morning of the prep. You may also put in the freezer.   You may try mixing some Crystal Light or Country Time Lemonade if you prefer. Mix in small amounts; add more if necessary.  Try drinking through a straw  Rinse mouth with water or a mouthwash between glasses, to remove after-taste.  Try sipping on a cold  beverage /ice/ popsicles between glasses of prep.  Place a piece of sugar-free hard candy in mouth between glasses.  If you become nauseated, try consuming smaller amounts, or stretch out the time between glasses. Stop for 30-60 minutes, then slowly start back drinking.        OTHER INSTRUCTIONS  You will need a responsible adult at least 66 years of age to accompany you and drive you home. This person must remain in the waiting room during your procedure. The hospital will cancel your procedure if you do not have a responsible adult with you.   1. Wear loose fitting clothing that  is easily removed. 2. Leave jewelry and other valuables at home.  3. Remove all body piercing jewelry and leave at home. 4. Total time from sign-in until discharge is approximately 2-3 hours. 5. You should go home directly after your procedure and rest. You can resume normal activities the day after your procedure. 6. The day of your procedure you should not:  Drive  Make legal decisions  Operate machinery  Drink alcohol  Return to work   You may call the office (Dept: [phone]) before 5:00pm, or page the doctor on call ([phone]) after 5:00pm, for further instructions, if necessary.   Insurance Information YOU WILL NEED TO CHECK WITH YOUR INSURANCE COMPANY FOR THE BENEFITS OF COVERAGE YOU HAVE FOR THIS PROCEDURE.  UNFORTUNATELY, NOT ALL INSURANCE COMPANIES HAVE BENEFITS TO COVER ALL OR PART OF THESE TYPES OF PROCEDURES.  IT IS YOUR RESPONSIBILITY TO CHECK YOUR BENEFITS, HOWEVER, WE WILL BE GLAD TO ASSIST YOU WITH ANY CODES YOUR INSURANCE COMPANY MAY NEED.    PLEASE NOTE THAT MOST INSURANCE COMPANIES WILL NOT COVER A SCREENING COLONOSCOPY FOR PEOPLE UNDER THE AGE OF 50  IF YOU HAVE BCBS INSURANCE, YOU MAY HAVE BENEFITS FOR A SCREENING COLONOSCOPY BUT IF POLYPS ARE FOUND THE DIAGNOSIS WILL CHANGE AND THEN YOU MAY HAVE A DEDUCTIBLE THAT WILL NEED TO BE MET. SO PLEASE MAKE SURE YOU CHECK YOUR BENEFITS FOR A SCREENING COLONOSCOPY AS WELL AS A DIAGNOSTIC COLONOSCOPY.  

## 2017-06-23 NOTE — Progress Notes (Addendum)
Gastroenterology Pre-Procedure Review  Request Date:06/23/17 Requesting Physician: Dr.Kikel [facility] ( no previous tcs)  PATIENT REVIEW QUESTIONS: The patient responded to the following health history questions as indicated:    1. Diabetes Melitis: yes (metformin) 2. Joint replacements in the past 12 months: no 3. Major health problems in the past 3 months: no 4. Has an artificial valve or MVP: no 5. Has a defibrillator: no 6. Has been advised in past to take antibiotics in advance of a procedure like teeth cleaning: no 7. Family history of colon cancer: no  8. Alcohol Use: no 9. History of sleep apnea: no  10. History of coronary artery or other vascular stents placed within the last 12 months: no 11. History of any prior anesthesia complications: no    MEDICATIONS & ALLERGIES:    Patient reports the following regarding taking any blood thinners:   Plavix? no Aspirin? yes (81mg ) Coumadin? no Brilinta? no Xarelto? no Eliquis? no Pradaxa? no Savaysa? no Effient? no  Patient confirms/reports the following medications:  Current Outpatient Medications  Medication Sig Dispense Refill  . aspirin EC 81 MG tablet Take 81 mg by mouth daily.    . hydrochlorothiazide (HYDRODIURIL) 25 MG tablet Take 1 tablet by mouth daily.  0  . lisinopril (PRINIVIL,ZESTRIL) 40 MG tablet Take 40 mg by mouth daily.    . metFORMIN (GLUCOPHAGE) 500 MG tablet Take 500 mg by mouth 2 (two) times daily with a meal.    . pantoprazole (PROTONIX) 40 MG tablet Take 1 tablet by mouth daily.  0  . rosuvastatin (CRESTOR) 20 MG tablet Take 1 tablet (20 mg total) by mouth daily. 90 tablet 3  . VITAMIN D, CHOLECALCIFEROL, PO Take 1 tablet by mouth daily.     No current facility-administered medications for this visit.     Patient confirms/reports the following allergies:  Allergies  Allergen Reactions  . Augmentin [Amoxicillin-Pot Clavulanate] Nausea And Vomiting    No orders of the defined types were  placed in this encounter.   AUTHORIZATION INFORMATION Primary Insurance:  [facility],  [place] #: [id number] Pre-Cert / Auth required:yes Pre-Cert / Auth #: [id number]   SCHEDULE INFORMATION: Procedure has been scheduled as follows:  Date: 08/22/17, Time: 9:30 Location: [facility]  This Gastroenterology Pre-Precedure Review Form is being routed to the following provider(s): Neil Crouch, PA

## 2017-06-24 MED ORDER — PEG 3350-KCL-NA BICARB-NACL 420 G PO SOLR: 4000.0000 mL | ORAL | 0 refills | Status: DC

## 2017-06-24 NOTE — Progress Notes (Signed)
Called pharmacy- they are not open yet. LM to cancel suprep and to call me.

## 2017-06-24 NOTE — Progress Notes (Signed)
Pt is aware.  

## 2017-06-24 NOTE — Progress Notes (Signed)
OK to schedule.  Day before TCS: metformin 1/2 tablet BID AM of TCS: hold metformin   PATIENT HAS H/O RENAL IMPAIRMENT PER NOTES. SHE SHOULD GET TRILYTE PREP NOT SUPREP.

## 2017-06-24 NOTE — Progress Notes (Signed)
Pharmacy called, pt has not picked up prep yet. It had a $100 copay anyway, I have sent in rx for trilyte and have changed her instructions and mailed them to the pt. I tried to call pt, lady answered and said she was at work. Asked her to have the pt call me.

## 2017-06-24 NOTE — Addendum Note (Signed)
Addended by: Claudina Lick on: 06/24/2017 09:41 AM   Modules accepted: Orders

## 2017-08-22 ENCOUNTER — Encounter (HOSPITAL_COMMUNITY)
Admission: RE | Disposition: A | Discharge status: Home / Self Care | Payer: Self-pay | Source: Ambulatory Visit | Attending: Gastroenterology

## 2017-08-22 ENCOUNTER — Ambulatory Visit (HOSPITAL_COMMUNITY)
Admission: RE | Admit: 2017-08-22 | Discharge: 2017-08-22 | Disposition: A | Discharge status: Home / Self Care | Payer: BLUE CROSS/BLUE SHIELD | Source: Ambulatory Visit | Attending: Gastroenterology | Admitting: Gastroenterology

## 2017-08-22 ENCOUNTER — Encounter (HOSPITAL_COMMUNITY): Payer: Self-pay

## 2017-08-22 ENCOUNTER — Other Ambulatory Visit: Payer: Self-pay

## 2017-08-22 DIAGNOSIS — Z7984 Long term (current) use of oral hypoglycemic drugs: Secondary | ICD-10-CM | POA: Insufficient documentation

## 2017-08-22 DIAGNOSIS — Z880 Allergy status to penicillin: Secondary | ICD-10-CM | POA: Insufficient documentation

## 2017-08-22 DIAGNOSIS — E7800 Pure hypercholesterolemia, unspecified: Secondary | ICD-10-CM | POA: Diagnosis not present

## 2017-08-22 DIAGNOSIS — Z7982 Long term (current) use of aspirin: Secondary | ICD-10-CM | POA: Diagnosis not present

## 2017-08-22 DIAGNOSIS — Z79899 Other long term (current) drug therapy: Secondary | ICD-10-CM | POA: Diagnosis not present

## 2017-08-22 DIAGNOSIS — K648 Other hemorrhoids: Secondary | ICD-10-CM | POA: Insufficient documentation

## 2017-08-22 DIAGNOSIS — D124 Benign neoplasm of descending colon: Secondary | ICD-10-CM | POA: Diagnosis not present

## 2017-08-22 DIAGNOSIS — K219 Gastro-esophageal reflux disease without esophagitis: Secondary | ICD-10-CM | POA: Insufficient documentation

## 2017-08-22 DIAGNOSIS — K644 Residual hemorrhoidal skin tags: Secondary | ICD-10-CM | POA: Insufficient documentation

## 2017-08-22 DIAGNOSIS — F17210 Nicotine dependence, cigarettes, uncomplicated: Secondary | ICD-10-CM | POA: Insufficient documentation

## 2017-08-22 DIAGNOSIS — Z1211 Encounter for screening for malignant neoplasm of colon: Secondary | ICD-10-CM | POA: Insufficient documentation

## 2017-08-22 DIAGNOSIS — I10 Essential (primary) hypertension: Secondary | ICD-10-CM | POA: Diagnosis not present

## 2017-08-22 DIAGNOSIS — Q438 Other specified congenital malformations of intestine: Secondary | ICD-10-CM | POA: Diagnosis not present

## 2017-08-22 HISTORY — PX: POLYPECTOMY: SHX5525

## 2017-08-22 HISTORY — PX: COLONOSCOPY: SHX5424

## 2017-08-22 LAB — GLUCOSE, CAPILLARY: Glucose-Capillary: 110 mg/dL — ABNORMAL HIGH (ref 70–99)

## 2017-08-22 SURGERY — COLONOSCOPY
Anesthesia: Moderate Sedation

## 2017-08-22 MED ORDER — SODIUM CHLORIDE 0.9 % IV SOLN
INTRAVENOUS | Status: DC
  Administered 2017-08-22: 09:00:00 via INTRAVENOUS

## 2017-08-22 MED ORDER — MIDAZOLAM HCL 5 MG/5ML IJ SOLN
INTRAMUSCULAR | Status: AC
  Filled 2017-08-22: qty 10

## 2017-08-22 MED ORDER — MIDAZOLAM HCL 5 MG/5ML IJ SOLN
INTRAMUSCULAR | Status: DC | PRN
  Administered 2017-08-22: 2 mg via INTRAVENOUS
  Administered 2017-08-22: 1 mg via INTRAVENOUS
  Administered 2017-08-22: 2 mg via INTRAVENOUS

## 2017-08-22 MED ORDER — MEPERIDINE HCL 100 MG/ML IJ SOLN
INTRAMUSCULAR | Status: DC | PRN
  Administered 2017-08-22 (×2): 25 mg via INTRAVENOUS

## 2017-08-22 MED ORDER — MEPERIDINE HCL 100 MG/ML IJ SOLN
INTRAMUSCULAR | Status: AC
  Filled 2017-08-22: qty 2

## 2017-08-22 NOTE — Discharge Instructions (Signed)
You had 1 small polyp removed. You have  SMALL internal hemorrhoids.   DRINK WATER TO KEEP YOUR URINE LIGHT YELLOW.  FOLLOW A HIGH FIBER DIET. AVOID ITEMS THAT CAUSE BLOATING & GAS. SEE INFO BELOW.  YOUR BIOPSY RESULTS WILL BE AVAILABLE IN 7 DAYS.   Next colonoscopy in 5-10 years.    Colonoscopy Care After Read the instructions outlined below and refer to this sheet in the next week. These discharge instructions provide you with general information on caring for yourself after you leave the hospital. While your treatment has been planned according to the most current medical practices available, unavoidable complications occasionally occur. If you have any problems or questions after discharge, call DR. Adelae Yodice, [phone].  ACTIVITY  You may resume your regular activity, but move at a slower pace for the next 24 hours.   Take frequent rest periods for the next 24 hours.   Walking will help get rid of the air and reduce the bloated feeling in your belly (abdomen).   No driving for 24 hours (because of the medicine (anesthesia) used during the test).   You may shower.   Do not sign any important legal documents or operate any machinery for 24 hours (because of the anesthesia used during the test).    NUTRITION  Drink plenty of fluids.   You may resume your normal diet as instructed by your doctor.   Begin with a light meal and progress to your normal diet. Heavy or fried foods are harder to digest and may make you feel sick to your stomach (nauseated).   Avoid alcoholic beverages for 24 hours or as instructed.    MEDICATIONS  You may resume your normal medications.   WHAT YOU CAN EXPECT TODAY  Some feelings of bloating in the abdomen.   Passage of more gas than usual.   Spotting of blood in your stool or on the toilet paper  .  IF YOU HAD POLYPS REMOVED DURING THE COLONOSCOPY:  Eat a soft diet IF YOU HAVE NAUSEA, BLOATING, ABDOMINAL PAIN, OR VOMITING.      FINDING OUT THE RESULTS OF YOUR TEST Not all test results are available during your visit. DR. Oneida Alar WILL CALL YOU WITHIN 14 DAYS OF YOUR PROCEDUE WITH YOUR RESULTS. Do not assume everything is normal if you have not heard from DR. Tamantha Saline, CALL HER OFFICE AT [phone].  SEEK IMMEDIATE MEDICAL ATTENTION AND CALL THE OFFICE: [phone] IF:  You have more than a spotting of blood in your stool.   Your belly is swollen (abdominal distention).   You are nauseated or vomiting.   You have a temperature over 101F.   You have abdominal pain or discomfort that is severe or gets worse throughout the day.   High-Fiber Diet A high-fiber diet changes your normal diet to include more whole grains, legumes, fruits, and vegetables. Changes in the diet involve replacing refined carbohydrates with unrefined foods. The calorie level of the diet is essentially unchanged. The Dietary Reference Intake (recommended amount) for adult males is 38 grams per day. For adult females, it is 25 grams per day. Pregnant and lactating women should consume 28 grams of fiber per day. Fiber is the intact part of a plant that is not broken down during digestion. Functional fiber is fiber that has been isolated from the plant to provide a beneficial effect in the body.  PURPOSE  Increase stool bulk.   Ease and regulate bowel movements.   Lower cholesterol.   REDUCE  RISK OF COLON CANCER  INDICATIONS THAT YOU NEED MORE FIBER  Constipation and hemorrhoids.   Uncomplicated diverticulosis (intestine condition) and irritable bowel syndrome.   Weight management.   As a protective measure against hardening of the arteries (atherosclerosis), diabetes, and cancer.   GUIDELINES FOR INCREASING FIBER IN THE DIET  Start adding fiber to the diet slowly. A gradual increase of about 5 more grams (2 slices of whole-wheat bread, 2 servings of most fruits or vegetables, or 1 bowl of high-fiber cereal) per day is best. Too  rapid an increase in fiber may result in constipation, flatulence, and bloating.   Drink enough water and fluids to keep your urine clear or pale yellow. Water, juice, or caffeine-free drinks are recommended. Not drinking enough fluid may cause constipation.   Eat a variety of high-fiber foods rather than one type of fiber.   Try to increase your intake of fiber through using high-fiber foods rather than fiber pills or supplements that contain small amounts of fiber.   The goal is to change the types of food eaten. Do not supplement your present diet with high-fiber foods, but replace foods in your present diet.   INCLUDE A VARIETY OF FIBER SOURCES  Replace refined and processed grains with whole grains, canned fruits with fresh fruits, and incorporate other fiber sources. White rice, white breads, and most bakery goods contain little or no fiber.   Brown whole-grain rice, buckwheat oats, and many fruits and vegetables are all good sources of fiber. These include: broccoli, Brussels sprouts, cabbage, cauliflower, beets, sweet potatoes, white potatoes (skin on), carrots, tomatoes, eggplant, squash, berries, fresh fruits, and dried fruits.   Cereals appear to be the richest source of fiber. Cereal fiber is found in whole grains and bran. Bran is the fiber-rich outer coat of cereal grain, which is largely removed in refining. In whole-grain cereals, the bran remains. In breakfast cereals, the largest amount of fiber is found in those with "bran" in their names. The fiber content is sometimes indicated on the label.   You may need to include additional fruits and vegetables each day.   In baking, for 1 cup white flour, you may use the following substitutions:   1 cup whole-wheat flour minus 2 tablespoons.   1/2 cup white flour plus 1/2 cup whole-wheat flour.   Polyps, Colon  A polyp is extra tissue that grows inside your body. Colon polyps grow in the large intestine. The large intestine, also  called the colon, is part of your digestive system. It is a long, hollow tube at the end of your digestive tract where your body makes and stores stool. Most polyps are not dangerous. They are benign. This means they are not cancerous. But over time, some types of polyps can turn into cancer. Polyps that are smaller than a pea are usually not harmful. But larger polyps could someday become or may already be cancerous. To be safe, doctors remove all polyps and test them.   WHO GETS POLYPS? Anyone can get polyps, but certain people are more likely than others. You may have a greater chance of getting polyps if:  You are over 50.   You have had polyps before.   Someone in your family has had polyps.   Someone in your family has had cancer of the large intestine.   Find out if someone in your family has had polyps. You may also be more likely to get polyps if you:   Eat a lot  of fatty foods   Smoke   Drink alcohol   Do not exercise  Eat too much   PREVENTION There is not one sure way to prevent polyps. You might be able to lower your risk of getting them if you:  Eat more fruits and vegetables and less fatty food.   Do not smoke.   Avoid alcohol.   Exercise every day.   Lose weight if you are overweight.   Eating more calcium and folate can also lower your risk of getting polyps. Some foods that are rich in calcium are milk, cheese, and broccoli. Some foods that are rich in folate are chickpeas, kidney beans, and spinach.   Hemorrhoids Hemorrhoids are dilated (enlarged) veins around the rectum. Sometimes clots will form in the veins. This makes them swollen and painful. These are called thrombosed hemorrhoids. Causes of hemorrhoids include:  Constipation.   Straining to have a bowel movement.   HEAVY LIFTING  HOME CARE INSTRUCTIONS  Eat a well balanced diet and drink 6 to 8 glasses of water every day to avoid constipation. You may also use a bulk laxative.   Avoid  straining to have bowel movements.   Keep anal area dry and clean.   Do not use a donut shaped pillow or sit on the toilet for long periods. This increases blood pooling and pain.   Move your bowels when your body has the urge; this will require less straining and will decrease pain and pressure.

## 2017-08-22 NOTE — H&P (Signed)
Primary Care Physician:  Vidal Schwalbe, MD Primary Gastroenterologist:  Dr. Oneida Alar  Pre-Procedure History & Physical: HPI:  Nancy Woodward is a 67 y.o. female here for [place].  Past Medical History:  Diagnosis Date  . GERD (gastroesophageal reflux disease)   . Hypercholesteremia   . Hypertension   . Kidney stone     Past Surgical History:  Procedure Laterality Date  . ABDOMINAL HYSTERECTOMY    . KIDNEY STONE SURGERY    . tumor of ovary removed      Prior to Admission medications   Medication Sig Start Date End Date Taking? Authorizing Provider  aspirin EC 81 MG tablet Take 81 mg by mouth daily.   Yes [provider]  aspirin-acetaminophen-caffeine (EXCEDRIN MIGRAINE) [phone] MG tablet Take 2 tablets by mouth daily as needed for headache.   Yes [provider]  hydrochlorothiazide (HYDRODIURIL) 25 MG tablet Take 25 mg by mouth daily.  02/13/15  Yes [provider]  lisinopril (PRINIVIL,ZESTRIL) 40 MG tablet Take 40 mg by mouth daily.   Yes [provider]  metFORMIN (GLUCOPHAGE) 500 MG tablet Take 500 mg by mouth 2 (two) times daily with a meal.   Yes [provider]  Multiple Vitamin (MULTIVITAMIN WITH MINERALS) TABS tablet Take 1 tablet by mouth daily.   Yes [provider]  ranitidine (ZANTAC) 150 MG tablet Take 150 mg by mouth daily as needed for heartburn.    Yes [provider]  rosuvastatin (CRESTOR) 40 MG tablet Take 40 mg by mouth daily.   Yes [provider]  Na Sulfate-K Sulfate-Mg Sulf (SUPREP BOWEL PREP KIT) 17.5-3.13-1.6 GM/177ML SOLN Take 1 kit by mouth as directed. Patient not taking: Reported on 08/17/2017 06/23/17   Mahala Menghini, PA-C  polyethylene glycol-electrolytes (TRILYTE) 420 g solution Take 4,000 mLs by mouth as directed. 06/24/17   Mahala Menghini, PA-C  rosuvastatin (CRESTOR) 20 MG tablet Take 1 tablet (20 mg total) by mouth daily. Patient not taking: Reported  on 08/17/2017 01/31/14   Fay Records, MD    Allergies as of 06/23/2017 - Review Complete 06/23/2017  Allergen Reaction Noted  . Augmentin [amoxicillin-pot clavulanate] Nausea And Vomiting 06/07/2017    Family History  Problem Relation Age of Onset  . Colon polyps Neg Hx   . Colon cancer Neg Hx     Social History   Socioeconomic History  . Marital status: Married    Spouse name: Not on file  . Number of children: Not on file  . Years of education: Not on file  . Highest education level: Not on file  Occupational History  . Not on file  Social Needs  . Financial resource strain: Not on file  . Food insecurity:    Worry: Not on file    Inability: Not on file  . Transportation needs:    Medical: Not on file    Non-medical: Not on file  Tobacco Use  . Smoking status: Current Every Day Smoker    Packs/day: 0.25    Types: Cigarettes  . Smokeless tobacco: Never Used  Substance and Sexual Activity  . Alcohol use: No  . Drug use: No  . Sexual activity: Yes    Birth control/protection: Surgical  Lifestyle  . Physical activity:    Days per week: Not on file    Minutes per session: Not on file  . Stress: Not on file  Relationships  . Social connections:    Talks on phone: Not on  file    Gets together: Not on file    Attends religious service: Not on file    Active member of club or organization: Not on file    Attends meetings of clubs or organizations: Not on file    Relationship status: Not on file  . Intimate partner violence:    Fear of current or ex partner: Not on file    Emotionally abused: Not on file    Physically abused: Not on file    Forced sexual activity: Not on file  Other Topics Concern  . Not on file  Social History Narrative  . Not on file    Review of Systems: See HPI, otherwise negative ROS   Physical Exam: BP (!) 120/59   Pulse 80   Temp 98.8 F (37.1 C) (Oral)   Resp 11   Ht 5' 6" (1.676 m)   Wt 176 lb (79.8 kg)   SpO2 98%   BMI  28.41 kg/m  General:   Alert,  pleasant and cooperative in NAD Head:  Normocephalic and atraumatic. Neck:  Supple; Lungs:  Clear throughout to auscultation.    Heart:  Regular rate and rhythm. Abdomen:  Soft, nontender and nondistended. Normal bowel sounds, without guarding, and without rebound.   Neurologic:  Alert and  oriented x4;  grossly normal neurologically.  Impression/Plan:    SCREENING  Plan:  1. TCS TODAY DISCUSSED PROCEDURE, BENEFITS, & RISKS: < 1% chance of medication reaction, bleeding, perforation, or rupture of spleen/liver.

## 2017-08-22 NOTE — Op Note (Signed)
[facility] Patient Name: Nancy Woodward Procedure Date: 08/22/2017 9:59 AM MRN: [id number] Date of Birth: 1950-08-20 Attending MD: Barney Drain MD, MD CSN: [id number] Age: 67 Admit Type: Outpatient Procedure:                Colonoscopy WITH COLD SNARE POLYPECTOMY Indications:              Screening for colorectal malignant neoplasm Providers:                Barney Drain MD, MD, Lurline Del, RN, Randa Spike, Technician Referring MD:             Vidal Schwalbe Medicines:                Meperidine 75 mg IV, Midazolam 5 mg IV Complications:            No immediate complications. Estimated Blood Loss:     Estimated blood loss was minimal. Procedure:                Pre-Anesthesia Assessment:                           - Prior to the procedure, a History and Physical                            was performed, and patient medications and                            allergies were reviewed. The patient's tolerance of                            previous anesthesia was also reviewed. The risks                            and benefits of the procedure and the sedation                            options and risks were discussed with the patient.                            All questions were answered, and informed consent                            was obtained. Prior Anticoagulants: The patient                            last took aspirin 1 day prior to the procedure. ASA                            Grade Assessment: II - A patient with mild systemic                            disease. After reviewing the risks and benefits,  the patient was deemed in satisfactory condition to                            undergo the procedure. After obtaining informed                            consent, the colonoscope was passed under direct                            vision. Throughout the procedure, the patient's                            blood pressure,  pulse, and oxygen saturations were                            monitored continuously. The CF-HQ190L ([id number])                            scope was introduced through the anus and advanced                            to the the cecum, identified by appendiceal orifice                            and ileocecal valve. The colonoscopy was somewhat                            difficult due to restricted mobility of the colon.                            Successful completion of the procedure was aided by                            increasing the dose of sedation medication,                            changing the patient to a supine position,                            straightening and shortening the scope to obtain                            bowel loop reduction and COLOWRAP. The patient                            tolerated the procedure fairly well. The quality of                            the bowel preparation was good. The ileocecal                            valve, appendiceal orifice, and rectum were  photographed. Scope In: 10:38:42 AM Scope Out: 10:57:14 AM Scope Withdrawal Time: 0 hours 12 minutes 19 seconds  Total Procedure Duration: 0 hours 18 minutes 32 seconds  Findings:      A 6 mm polyp was found in the descending colon. The polyp was sessile.       The polyp was removed with a cold snare. Resection and retrieval were       complete.      The recto-sigmoid colon and sigmoid colon were moderately tortuous.      External hemorrhoids were found during retroflexion. The hemorrhoids       were moderate.      Internal hemorrhoids were found. The hemorrhoids were small. Impression:               - One 6 mm polyp in the descending colon, removed                            with a cold snare. Resected and retrieved.                           - Tortuous colon.                           - External hemorrhoids. Moderate Sedation:      Moderate (conscious) sedation  was administered by the endoscopy nurse       and supervised by the endoscopist. The following parameters were       monitored: oxygen saturation, heart rate, blood pressure, and response       to care. Total physician intraservice time was 34 minutes. Recommendation:           - Repeat colonoscopy in 5-10 years for surveillance.                           - High fiber diet.                           - Continue present medications.                           - Await pathology results.                           - Patient has a contact number available for                            emergencies. The signs and symptoms of potential                            delayed complications were discussed with the                            patient. Return to normal activities tomorrow.                            Written discharge instructions were provided to the  patient. Procedure Code(s):        --- Professional ---                           [phone], Colonoscopy, flexible; with removal of                            tumor(s), polyp(s), or other lesion(s) by snare                            technique                           G0500, Moderate sedation services provided by the                            same physician or other qualified health care                            professional performing a gastrointestinal                            endoscopic service that sedation supports,                            requiring the presence of an independent trained                            observer to assist in the monitoring of the                            patient's level of consciousness and physiological                            status; initial 15 minutes of intra-service time;                            patient age 45 years or older (additional time may                            be reported with [phone], as appropriate)                           [phone], Moderate sedation  services provided by the                            same physician or other qualified health care                            professional performing the diagnostic or                            therapeutic service that the sedation supports,  requiring the presence of an independent trained                            observer to assist in the monitoring of the                            patient's level of consciousness and physiological                            status; each additional 15 minutes intraservice                            time (List separately in addition to code for                            primary service) Diagnosis Code(s):        --- Professional ---                           Z12.11, Encounter for screening for malignant                            neoplasm of colon                           D12.4, Benign neoplasm of descending colon                           K64.4, Residual hemorrhoidal skin tags                           Q43.8, Other specified congenital malformations of                            intestine CPT copyright 2017 [employer]. All rights reserved. The codes documented in this report are preliminary and upon coder review may  be revised to meet current compliance requirements. Barney Drain, MD Barney Drain MD, MD 08/22/2017 11:05:11 AM This report has been signed electronically. Number of Addenda: 0

## 2017-08-23 ENCOUNTER — Telehealth: Payer: Self-pay | Admitting: Gastroenterology

## 2017-08-23 NOTE — Telephone Encounter (Signed)
Please call pt. She had ONE simple adenoma removed from her colon.   DRINK WATER TO KEEP YOUR URINE LIGHT YELLOW.  FOLLOW A HIGH FIBER DIET. AVOID ITEMS THAT CAUSE BLOATING & GAS.   Next colonoscopy BETWEEN 2024 AND 2026.

## 2017-08-24 NOTE — Telephone Encounter (Signed)
Pt notified of results

## 2017-08-24 NOTE — Telephone Encounter (Signed)
CC'ED TO PCP AND REMINDER IN EPIC

## 2017-08-25 ENCOUNTER — Encounter (HOSPITAL_COMMUNITY): Payer: Self-pay | Admitting: Gastroenterology

## 2019-07-17 ENCOUNTER — Ambulatory Visit: Payer: Medicare HMO

## 2019-07-17 ENCOUNTER — Encounter: Payer: Self-pay | Admitting: Orthopaedic Surgery

## 2019-07-17 ENCOUNTER — Other Ambulatory Visit: Payer: Self-pay

## 2019-07-17 ENCOUNTER — Ambulatory Visit (INDEPENDENT_AMBULATORY_CARE_PROVIDER_SITE_OTHER): Payer: Medicare HMO | Admitting: Orthopaedic Surgery

## 2019-07-17 VITALS — BP 170/95 | HR 71 | Ht 66.0 in | Wt 171.5 lb

## 2019-07-17 DIAGNOSIS — M542 Cervicalgia: Secondary | ICD-10-CM | POA: Diagnosis not present

## 2019-07-17 MED ORDER — CYCLOBENZAPRINE HCL 10 MG PO TABS: 10.0000 mg | ORAL_TABLET | Freq: Every day | ORAL | 0 refills | Status: DC

## 2019-07-17 NOTE — Patient Instructions (Signed)

## 2019-07-17 NOTE — Progress Notes (Signed)
Subjective:    Patient ID: Nancy Woodward, female    DOB: [birth year], 69 y.o.   MRN: [id number]  HPI She has had neck pain since August 2020  There has been no trauma, no swelling, no redness, no paresthesias.  She has no headaches.  The pain is in the mid portion of the neck.  She has been followed at [facility].  I have copies of their notes.  She has tried heat, ice, rubs, Advil with no help.  She has been on prednisone dose pack with little help.   Review of Systems  Constitutional: Positive for activity change.  Musculoskeletal: Positive for arthralgias, back pain and myalgias.  All other systems reviewed and are negative.  For Review of Systems, all other systems reviewed and are negative.  The following is a summary of the past history medically, past history surgically, known current medicines, social history and family history.  This information is gathered electronically by the computer from prior information and documentation.  I review this each visit and have found including this information at this point in the chart is beneficial and informative.   Past Medical History:  Diagnosis Date   GERD (gastroesophageal reflux disease)    Hypercholesteremia    Hypertension    Kidney stone     Past Surgical History:  Procedure Laterality Date   ABDOMINAL HYSTERECTOMY     COLONOSCOPY N/A 08/22/2017   Procedure: COLONOSCOPY;  Surgeon: Danie Binder, MD;  [facility];  Service: Endoscopy;  Laterality: N/A;  9:30   KIDNEY STONE SURGERY     POLYPECTOMY  08/22/2017   Procedure: POLYPECTOMY;  Surgeon: Danie Binder, MD;  [facility];  Service: Endoscopy;;  decending   tumor of ovary removed      Current Outpatient Medications on File Prior to Visit  Medication Sig Dispense Refill   aspirin EC 81 MG tablet Take 81 mg by mouth daily.     hydrochlorothiazide (HYDRODIURIL) 25 MG tablet Take 25 mg by mouth daily.   0   lisinopril  (PRINIVIL,ZESTRIL) 40 MG tablet Take 40 mg by mouth daily.     metFORMIN (GLUCOPHAGE) 500 MG tablet Take 500 mg by mouth 2 (two) times daily with a meal.     Multiple Vitamin (MULTIVITAMIN WITH MINERALS) TABS tablet Take 1 tablet by mouth daily.     ranitidine (ZANTAC) 150 MG tablet Take 150 mg by mouth daily as needed for heartburn.      rosuvastatin (CRESTOR) 40 MG tablet Take 40 mg by mouth daily.     aspirin-acetaminophen-caffeine (EXCEDRIN MIGRAINE) 250-250-65 MG tablet Take 2 tablets by mouth daily as needed for headache. (Patient not taking: Reported on 07/17/2019)     rosuvastatin (CRESTOR) 20 MG tablet Take 1 tablet (20 mg total) by mouth daily. (Patient not taking: Reported on 07/17/2019) 90 tablet 3   No current facility-administered medications on file prior to visit.    Social History   Socioeconomic History   Marital status: Married    Spouse name: Not on file   Number of children: Not on file   Years of education: Not on file   Highest education level: Not on file  Occupational History   Not on file  Tobacco Use   Smoking status: Current Every Day Smoker    Packs/day: 0.25    Types: Cigarettes   Smokeless tobacco: Never Used  Vaping Use   Vaping Use: Never used  Substance and Sexual Activity  Alcohol use: No   Drug use: No   Sexual activity: Yes    Birth control/protection: Surgical  Other Topics Concern   Not on file  Social History Narrative   Not on file   Social Determinants of Health   Financial Resource Strain:    Difficulty of Paying Living Expenses:   Food Insecurity:    Worried About Charity fundraiser in the Last Year:    Arboriculturist in the Last Year:   Transportation Needs:    Film/video editor (Medical):    Lack of Transportation (Non-Medical):   Physical Activity:    Days of Exercise per Week:    Minutes of Exercise per Session:   Stress:    Feeling of Stress :   Social Connections:    Frequency of  Communication with Friends and Family:    Frequency of Social Gatherings with Friends and Family:    Attends Religious Services:    Active Member of Clubs or Organizations:    Attends Music therapist:    Marital Status:   Intimate Partner Violence:    Fear of Current or Ex-Partner:    Emotionally Abused:    Physically Abused:    Sexually Abused:     Family History  Problem Relation Age of Onset   Colon polyps Neg Hx    Colon cancer Neg Hx     BP (!) 170/95    Pulse 71    Ht 5\' 6"  (1.676 m)    Wt 171 lb 8 oz (77.8 kg)    BMI 27.68 kg/m   Body mass index is 27.68 kg/m.     Objective:   Physical Exam Vitals and nursing note reviewed.  Constitutional:      Appearance: She is well-developed.  HENT:     Head: Normocephalic and atraumatic.  Eyes:     Conjunctiva/sclera: Conjunctivae normal.     Pupils: Pupils are equal, round, and reactive to light.  Cardiovascular:     Rate and Rhythm: Normal rate and regular rhythm.  Pulmonary:     Effort: Pulmonary effort is normal.  Abdominal:     Palpations: Abdomen is soft.  Musculoskeletal:       Arms:     Cervical back: Normal range of motion and neck supple.  Skin:    General: Skin is warm and dry.  Neurological:     Mental Status: She is alert and oriented to person, place, and time.     Cranial Nerves: No cranial nerve deficit.     Motor: No abnormal muscle tone.     Coordination: Coordination normal.     Deep Tendon Reflexes: Reflexes are normal and symmetric. Reflexes normal.  Psychiatric:        Behavior: Behavior normal.        Thought Content: Thought content normal.        Judgment: Judgment normal.    X-rays were done of the cervical spine, reported separately.       Assessment & Plan:   Encounter Diagnosis  Name Primary?   Cervicalgia Yes   I will refill her Flexeril.  She is taking Relafen 750, continue that.  Begin PT for the neck.  Return in three weeks.  Call if  any problem.  Precautions discussed.   Electronically Signed Sanjuana Kava, MD 6/15/20219:24 AM

## 2019-07-25 ENCOUNTER — Ambulatory Visit (HOSPITAL_COMMUNITY): Payer: Medicare HMO | Attending: Orthopaedic Surgery | Admitting: Physical Therapy

## 2019-07-25 ENCOUNTER — Encounter (HOSPITAL_COMMUNITY): Payer: Self-pay | Admitting: Physical Therapy

## 2019-07-25 ENCOUNTER — Other Ambulatory Visit: Payer: Self-pay

## 2019-07-25 DIAGNOSIS — M542 Cervicalgia: Secondary | ICD-10-CM | POA: Diagnosis present

## 2019-07-25 NOTE — Patient Instructions (Signed)
Strengthening: Lateral Bend - Isometric (in Neutral)    Using light pressure from your palm , press into right temple. Resist bending head sideways. Hold __5__ seconds. Repeat __10__ times per set. Do _1___ sets per session. Do _2Scapular Retraction (Standing)    With arms at sides, pinch shoulder blades together. Repeat _10___ times per set. Do1 ____ sets per session. Do _2___ sessions per day. May do sitting Neck: Retraction    Sit with back and head straight. Pull chin back to line up ear with shoulder. Do not turn or tilt head. May assist if child cannot keep correct position. Hold _5___ seconds. Repeat _10___ times. Do __2__ sessions per day. CAUTION: Movement should be gentle, steady and slow.  Copyright  VHI. All rights reserved.

## 2019-07-25 NOTE — Therapy (Signed)
[facility] [address] [place], [place], [address] Phone: [phone]   Fax:  [phone]  Physical Therapy Evaluation  Patient Details  Name: Nancy Woodward MRN: [id number] Date of Birth: 01-Feb-1951 Referring Provider (PT): Sanjuana Kava    Encounter Date: 07/25/2019   PT End of Session - 07/25/19 1141    Visit Number 1    Number of Visits 8    Date for PT Re-Evaluation 08/24/19    Authorization Type [employer] medicare: requested 8 visits    Progress Note Due on Visit 8    PT Start Time 1045    PT Stop Time 1130    PT Time Calculation (min) 45 min    Activity Tolerance Patient tolerated treatment well    Behavior During Therapy [facility] for tasks assessed/performed           Past Medical History:  Diagnosis Date  . GERD (gastroesophageal reflux disease)   . Hypercholesteremia   . Hypertension   . Kidney stone     Past Surgical History:  Procedure Laterality Date  . ABDOMINAL HYSTERECTOMY    . COLONOSCOPY N/A 08/22/2017   Procedure: COLONOSCOPY;  Surgeon: Danie Binder, MD;  [facility];  Service: Endoscopy;  Laterality: N/A;  9:30  . KIDNEY STONE SURGERY    . POLYPECTOMY  08/22/2017   Procedure: POLYPECTOMY;  Surgeon: Danie Binder, MD;  [facility];  Service: Endoscopy;;  decending  . tumor of ovary removed      There were no vitals filed for this visit.    Subjective Assessment - 07/25/19 1105    Subjective PT states that she has been having increased neck pain since October 2020.  She states that her pain is equal on both her right and her left and go up to her head.  The pain comes and goes but is there everyday.  The mm relaxors seem to help some.  She has now been referred to skilled PT    Pertinent History HTN, OA    How long can you sit comfortably? no problem    How long can you stand comfortably? does not affect    How long can you walk comfortably? does not affect    Diagnostic  tests x ray    Patient Stated Goals no pain    Currently in Pain? Yes    Pain Score --   0; worst is an 8/10   Pain Location Neck    Pain Orientation Right;Left    Pain Descriptors / Indicators Shooting;Sharp    Pain Type Chronic pain    Pain Onset More than a month ago    Pain Frequency Intermittent    Aggravating Factors  not sure    Pain Relieving Factors mm relaxor    Effect of Pain on Daily Activities PT just keeps going              [facility] PT Assessment - 07/25/19 0001      Assessment   Medical Diagnosis Cervicalgia     Referring Provider (PT) Sanjuana Kava     Onset Date/Surgical Date 11/16/18    Next MD Visit 08/02/2019    Prior Therapy none       Precautions   Precautions None      Balance Screen   Has the patient fallen in the past 6 months No    Has the patient had a decrease in activity level because of a fear of  falling?  No      Home Ecologist residence      Prior Function   Level of Independence Independent      Cognition   Overall Cognitive Status Within Functional Limits for tasks assessed      Observation/Other Assessments   Focus on Therapeutic Outcomes (FOTO)  67; 33% affected       Posture/Postural Control   Posture/Postural Control Postural limitations    Postural Limitations Rounded Shoulders;Forward head      ROM / Strength   AROM / PROM / Strength AROM;Strength      AROM   AROM Assessment Site Cervical    Cervical Flexion wfl    Cervical Extension wfl    Cervical - Right Side Bend 40    Cervical - Left Side Bend 40    Cervical - Right Rotation 60    Cervical - Left Rotation 65      Strength   Strength Assessment Site Cervical    Cervical Extension 5/5    Cervical - Right Side Bend 4+/5    Cervical - Left Side Bend 4-/5      Palpation   Palpation comment minimal spasms but noted tightness in B upper trap mm                       Objective measurements completed on examination:  See above findings.       [facility] Adult PT Treatment/Exercise - 07/25/19 0001      Exercises   Exercises Neck      Neck Exercises: Seated   Cervical Isometrics Right lateral flexion;Left lateral flexion;3 secs;5 reps    Neck Retraction 10 reps    Neck Retraction Limitations scapular retractions as well x 10                   PT Education - 07/25/19 1137    Education Details HEP ; showed pt the relationsip of pressure on the cervical discs/mm with increased cervical flexion    Person(s) Educated Patient    Methods Explanation;Demonstration;Handout;Verbal cues;Tactile cues    Comprehension Verbalized understanding;Returned demonstration            PT Short Term Goals - 07/25/19 1151      PT SHORT TERM GOAL #1   Title PT to be I in a HEP to allow pt cervical  pain to be no greater than a 6/10    Time 2    Period Weeks    Status New    Target Date 08/08/19      PT SHORT TERM GOAL #2   Title Pt cervical rotation to be at least 75 degrees bilaterally to improve safety while driving    Time 2    Period Weeks             PT Long Term Goals - 07/25/19 1153      PT LONG TERM GOAL #1   Title Pt to be I in an advance HEP to increse cervical strength to 5/5 to decrease her cervical  pain to no greater than a 2/10 to allow pt to lift 10# to overhead shelf without increased pain.    Time 4    Period Weeks    Status New    Target Date 08/22/19      PT LONG TERM GOAL #2   Title Pt to be able to verbalize the importance of posture as well as using  electronics at [employer] in keeping cervical pain to a minimum.    Time 2    Period Weeks    Status New                  Plan - 07/25/19 1144    Clinical Impression Statement Ms. Blackstock has had an insidious onset of cervical pain starting in October of 2020 that has not resolved, therefore, she is being referred to skilled PT.  Her pain in intermittent but occurs multiple times throughout the day.  The pt is  unsure of what causes the pain but the mm relaxors that she is currently taking does decrease the pain while she is on them.  Evaluation demonstrates decreased ROM, strength and increased mm tension and pain.  Ms. Kawa will benefit from skilled PT to address these issues and decrease her pain so that she can stop taking the mm relaxors.    Personal Factors and Comorbidities Time since onset of injury/illness/exacerbation    Stability/Clinical Decision Making Stable/Uncomplicated    Clinical Decision Making Low    Rehab Potential Good    PT Frequency 2x / week    PT Duration 4 weeks    PT Treatment/Interventions Patient/family education;Manual techniques;Therapeutic exercise;Ultrasound    PT Next Visit Plan begin w-back, x to V, Y lift off, Tband for scapular retraction and manual    PT Home Exercise Plan eval:  cervical and scapular retraction, cervical isometrics for side bending.           Patient will benefit from skilled therapeutic intervention in order to improve the following deficits and impairments:  Decreased range of motion, Pain, Increased muscle spasms  Visit Diagnosis: Cervicalgia - Plan: PT plan of care cert/re-cert     Problem List Patient Active Problem List   Diagnosis Date Noted  . Special screening for malignant neoplasms, colon     Rayetta Humphrey, PT CLT [phone] 07/25/2019, 12:00 PM  [place] [address] [place], [place], [address] Phone: [phone]   Fax:  [phone]  Name: Bernadine Melecio MRN: [id number] Date of Birth: [birth year]

## 2019-07-31 ENCOUNTER — Other Ambulatory Visit: Payer: Self-pay

## 2019-07-31 ENCOUNTER — Encounter (HOSPITAL_COMMUNITY): Payer: Self-pay

## 2019-07-31 ENCOUNTER — Ambulatory Visit (HOSPITAL_COMMUNITY): Payer: Medicare HMO

## 2019-07-31 DIAGNOSIS — M542 Cervicalgia: Secondary | ICD-10-CM

## 2019-07-31 NOTE — Patient Instructions (Signed)
Scapular Retraction: Abduction (Standing)    With arms elevated and elbows bent to 90, pinch shoulder blades together and press arms back. Repeat 10 times per set. Do 1-2 sets per day. http://orth.exer.us/951   Copyright  VHI. All rights reserved.

## 2019-07-31 NOTE — Therapy (Signed)
[place], [place], [address] Phone: [phone]   Fax:  [phone]  Physical Therapy Treatment  Patient Details  Name: Sameen Leas MRN: [id number] Date of Birth: [birth year] Referring Provider (PT): Sanjuana Kava    Encounter Date: 07/31/2019   PT End of Session - 07/31/19 1629    Visit Number 2    Number of Visits 8    Date for PT Re-Evaluation 08/24/19    Authorization Type [employer] medicare: requested 8 visits    Progress Note Due on Visit 8    PT Start Time 1620    PT Stop Time 1700    PT Time Calculation (min) 40 min    Activity Tolerance Patient tolerated treatment well    Behavior During Therapy [facility] for tasks assessed/performed           Past Medical History:  Diagnosis Date  . GERD (gastroesophageal reflux disease)   . Hypercholesteremia   . Hypertension   . Kidney stone     Past Surgical History:  Procedure Laterality Date  . ABDOMINAL HYSTERECTOMY    . COLONOSCOPY N/A 08/22/2017   Procedure: COLONOSCOPY;  Surgeon: Danie Binder, MD;  [facility];  Service: Endoscopy;  Laterality: N/A;  9:30  . KIDNEY STONE SURGERY    . POLYPECTOMY  08/22/2017   Procedure: POLYPECTOMY;  Surgeon: Danie Binder, MD;  [facility];  Service: Endoscopy;;  decending  . tumor of ovary removed      There were no vitals filed for this visit.   Subjective Assessment - 07/31/19 1623    Subjective Pt reports some pain in neck earlier today, none currently.  Has began some of the HEP exercises that she feels comfortable to complete.    Pertinent History HTN, OA    Currently in Pain? No/denies                             [facility] Adult PT Treatment/Exercise - 07/31/19 0001      Exercises   Exercises Neck      Neck Exercises: Theraband   Shoulder Extension 10 reps;Red    Shoulder Extension Limitations 3" holds    Rows 10 reps;Red    Rows Limitations 3" holds       Neck Exercises: Standing   Other Standing Exercises Y lift off 10x 5"      Neck Exercises: Seated   Cervical Isometrics Right lateral flexion;Left lateral flexion;3 secs;5 reps    Neck Retraction 10 reps    Neck Retraction Limitations chin tuck 10x 5"    X to V 10 reps    X to V Limitations cueing for sequence    W Back 10 reps    Other Seated Exercise 3D cervical excursin    Other Seated Exercise  scapular retraciotn      Manual Therapy   Manual Therapy Soft tissue mobilization    Manual therapy comments Manual complete separate than rest of tx    Soft tissue mobilization Supine: cervical mm with LE elevated on wedge                  PT Education - 07/31/19 1627    Education Details Reviewed goals, educated importance of HEP compliance.  Pt able to recall 1/3 HEP, required education form/mechanics during cervical retraciton and isometric side bend.    Person(s) Educated Patient    Methods  Explanation;Demonstration;Tactile cues;Verbal cues    Comprehension Verbalized understanding;Returned demonstration;Verbal cues required            PT Short Term Goals - 07/25/19 1151      PT SHORT TERM GOAL #1   Title PT to be I in a HEP to allow pt cervical  pain to be no greater than a 6/10    Time 2    Period Weeks    Status New    Target Date 08/08/19      PT SHORT TERM GOAL #2   Title Pt cervical rotation to be at least 75 degrees bilaterally to improve safety while driving    Time 2    Period Weeks             PT Long Term Goals - 07/25/19 1153      PT LONG TERM GOAL #1   Title Pt to be I in an advance HEP to increse cervical strength to 5/5 to decrease her cervical  pain to no greater than a 2/10 to allow pt to lift 10# to overhead shelf without increased pain.    Time 4    Period Weeks    Status New    Target Date 08/22/19      PT LONG TERM GOAL #2   Title Pt to be able to verbalize the importance of posture as well as using electronics at eyelevel in  keeping cervical pain to a minimum.    Time 2    Period Weeks    Status New                 Plan - 07/31/19 1631    Clinical Impression Statement Reviewed goals, educated importnace of HEP compliance and reviewed mechanics with cervical isometric/retraction to improve form/mechanics at home.  Pt educated importance of posture for pain control.  Session focus wiht cervical mobiity and postural strengthening, pt able to complete all exercises with good form following initial cueing.  EOS with manual STM to address restrictions wiht cervical musculature with reports of some relief following.    Personal Factors and Comorbidities Time since onset of injury/illness/exacerbation    Stability/Clinical Decision Making Stable/Uncomplicated    Clinical Decision Making Low    Rehab Potential Good    PT Frequency 2x / week    PT Duration 4 weeks    PT Treatment/Interventions Patient/family education;Manual techniques;Therapeutic exercise;Ultrasound    PT Next Visit Plan Continue postural strengthening with cervical and scapular retraction, theraband and manual.  Begin UE flexion wiht back against wall next session.    PT Home Exercise Plan eval:  cervical and scapular retraction, cervical isometrics for side bending.           Patient will benefit from skilled therapeutic intervention in order to improve the following deficits and impairments:  Decreased range of motion, Pain, Increased muscle spasms  Visit Diagnosis: Cervicalgia     Problem List Patient Active Problem List   Diagnosis Date Noted  . Special screening for malignant neoplasms, colon    Ihor Austin, LPTA/CLT; CBIS [phone]  Aldona Lento 07/31/2019, 6:08 PM  [place], [place], [address] Phone: [phone]   Fax:  [phone]  Name: Ellarose Brandi MRN: [id number] Date of Birth: July 03, 1950

## 2019-08-02 ENCOUNTER — Ambulatory Visit (HOSPITAL_COMMUNITY): Payer: Medicare HMO | Admitting: Physical Therapy

## 2019-08-02 ENCOUNTER — Telehealth (HOSPITAL_COMMUNITY): Payer: Self-pay | Admitting: Physical Therapy

## 2019-08-02 ENCOUNTER — Ambulatory Visit (INDEPENDENT_AMBULATORY_CARE_PROVIDER_SITE_OTHER): Payer: Medicare HMO | Admitting: Orthopaedic Surgery

## 2019-08-02 ENCOUNTER — Other Ambulatory Visit: Payer: Self-pay

## 2019-08-02 ENCOUNTER — Encounter: Payer: Self-pay | Admitting: Orthopaedic Surgery

## 2019-08-02 VITALS — BP 154/89 | HR 88 | Ht 66.0 in | Wt 173.5 lb

## 2019-08-02 DIAGNOSIS — M542 Cervicalgia: Secondary | ICD-10-CM | POA: Diagnosis not present

## 2019-08-02 DIAGNOSIS — F17210 Nicotine dependence, cigarettes, uncomplicated: Secondary | ICD-10-CM

## 2019-08-02 NOTE — Patient Instructions (Signed)

## 2019-08-02 NOTE — Telephone Encounter (Signed)
She has another apptment and can not be here today

## 2019-08-02 NOTE — Progress Notes (Signed)
Patient TX:MIWOEH Nancy Woodward, female DOB:March 09, 1950, 69 y.o. OZY:[id number]  Chief Complaint  Patient presents with  . Neck Pain    doing some what better    HPI  Nancy Woodward is a 69 y.o. female who has neck pain.  She has been going to PT.  She is improving.  I have reviewed the PT notes. I want her to continue PT and continue her exercises at home.  She has no new trauma, no paresthesias.     Body mass index is 28 kg/m.  ROS  Review of Systems  Constitutional: Positive for activity change.  Musculoskeletal: Positive for arthralgias, back pain and myalgias.  All other systems reviewed and are negative.   All other systems reviewed and are negative.  The following is a summary of the past history medically, past history surgically, known current medicines, social history and family history.  This information is gathered electronically by the computer from prior information and documentation.  I review this each visit and have found including this information at this point in the chart is beneficial and informative.    Past Medical History:  Diagnosis Date  . GERD (gastroesophageal reflux disease)   . Hypercholesteremia   . Hypertension   . Kidney stone     Past Surgical History:  Procedure Laterality Date  . ABDOMINAL HYSTERECTOMY    . COLONOSCOPY N/A 08/22/2017   Procedure: COLONOSCOPY;  Surgeon: Danie Binder, MD;  [facility];  Service: Endoscopy;  Laterality: N/A;  9:30  . KIDNEY STONE SURGERY    . POLYPECTOMY  08/22/2017   Procedure: POLYPECTOMY;  Surgeon: Danie Binder, MD;  [facility]: [facility];  Service: Endoscopy;;  decending  . tumor of ovary removed      Family History  Problem Relation Age of Onset  . Colon polyps Neg Hx   . Colon cancer Neg Hx     Social History Social History   Tobacco Use  . Smoking status: Current Every Day Smoker    Packs/day: 0.25    Types: Cigarettes  . Smokeless tobacco: Never Used  Vaping Use  .  Vaping Use: Never used  Substance Use Topics  . Alcohol use: No  . Drug use: No    Allergies  Allergen Reactions  . Augmentin [Amoxicillin-Pot Clavulanate] Nausea And Vomiting    Has patient had a PCN reaction causing immediate rash, facial/tongue/throat swelling, SOB or lightheadedness with hypotension: No Has patient had a PCN reaction causing severe rash involving mucus membranes or skin necrosis: No Has patient had a PCN reaction that required hospitalization: No Has patient had a PCN reaction occurring within the last 10 years: No If all of the above answers are "NO", then may proceed with Cephalosporin use.     Current Outpatient Medications  Medication Sig Dispense Refill  . aspirin EC 81 MG tablet Take 81 mg by mouth daily.    Marland Kitchen aspirin-acetaminophen-caffeine (EXCEDRIN MIGRAINE) 250-250-65 MG tablet Take 2 tablets by mouth daily as needed for headache.     . cyclobenzaprine (FLEXERIL) 10 MG tablet Take 1 tablet (10 mg total) by mouth at bedtime. One tablet every night at bedtime as needed for spasm. 30 tablet 0  . lisinopril (PRINIVIL,ZESTRIL) 40 MG tablet Take 40 mg by mouth daily.    . metFORMIN (GLUCOPHAGE) 500 MG tablet Take 500 mg by mouth 2 (two) times daily with a meal.    . Multiple Vitamin (MULTIVITAMIN WITH MINERALS) TABS tablet Take 1 tablet by mouth daily.    Marland Kitchen  ranitidine (ZANTAC) 150 MG tablet Take 150 mg by mouth daily as needed for heartburn.     . rosuvastatin (CRESTOR) 20 MG tablet Take 1 tablet (20 mg total) by mouth daily. 90 tablet 3  . rosuvastatin (CRESTOR) 40 MG tablet Take 40 mg by mouth daily.    . hydrochlorothiazide (HYDRODIURIL) 25 MG tablet Take 25 mg by mouth daily.  (Patient not taking: Reported on 08/02/2019)  0   No current facility-administered medications for this visit.     Physical Exam  Blood pressure (!) 154/89, pulse 88, height 5\' 6"  (1.676 m), weight 173 lb 8 oz (78.7 kg).  Constitutional: overall normal hygiene, normal nutrition,  well developed, normal grooming, normal body habitus. Assistive device:none  Musculoskeletal: gait and station Limp none, muscle tone and strength are normal, no tremors or atrophy is present.  .  Neurological: coordination overall normal.  Deep tendon reflex/nerve stretch intact.  Sensation normal.  Cranial nerves II-XII intact.   Skin:   Normal overall no scars, lesions, ulcers or rashes. No psoriasis.  Psychiatric: Alert and oriented x 3.  Recent memory intact, remote memory unclear.  Normal mood and affect. Well groomed.  Good eye contact.  Cardiovascular: overall no swelling, no varicosities, no edema bilaterally, normal temperatures of the legs and arms, no clubbing, cyanosis and good capillary refill.  Lymphatic: palpation is normal.  Neck has full ROM.  NV intact.  Grips good.  All other systems reviewed and are negative   The patient has been educated about the nature of the problem(s) and counseled on treatment options.  The patient appeared to understand what I have discussed and is in agreement with it.  Encounter Diagnoses  Name Primary?  . Cervicalgia Yes  . Nicotine dependence, cigarettes, uncomplicated     PLAN Call if any problems.  Precautions discussed.  Continue current medications.   Return to clinic 3 weeks   Continue PT.  Electronically Signed Sanjuana Kava, MD 7/1/20219:31 AM

## 2019-08-13 ENCOUNTER — Telehealth (HOSPITAL_COMMUNITY): Payer: Self-pay | Admitting: Physical Therapy

## 2019-08-13 NOTE — Telephone Encounter (Signed)
S/w pt she wants to cx  7/13 and she will be here on 7/15.

## 2019-08-14 ENCOUNTER — Encounter (HOSPITAL_COMMUNITY): Payer: Medicare HMO | Admitting: Physical Therapy

## 2019-08-16 ENCOUNTER — Encounter (HOSPITAL_COMMUNITY): Payer: Self-pay

## 2019-08-16 ENCOUNTER — Other Ambulatory Visit: Payer: Self-pay

## 2019-08-16 ENCOUNTER — Ambulatory Visit (HOSPITAL_COMMUNITY): Payer: Medicare HMO | Attending: Orthopaedic Surgery

## 2019-08-16 DIAGNOSIS — M542 Cervicalgia: Secondary | ICD-10-CM | POA: Diagnosis present

## 2019-08-16 NOTE — Therapy (Signed)
[place], [place], [address] Phone: [phone]   Fax:  [phone]  Physical Therapy Treatment  Patient Details  Name: Nancy Woodward MRN: [id number] Date of Birth: Jul 02, 1950 Referring Provider (PT): Sanjuana Kava    Encounter Date: 08/16/2019   PT End of Session - 08/16/19 1453    Visit Number 3    Number of Visits 8    Date for PT Re-Evaluation 08/24/19    Authorization Type [employer] medicare: requested 8 visits    Progress Note Due on Visit 8    PT Start Time 1449    PT Stop Time 1528    PT Time Calculation (min) 39 min    Activity Tolerance Patient tolerated treatment well    Behavior During Therapy WFL for tasks assessed/performed           Past Medical History:  Diagnosis Date   GERD (gastroesophageal reflux disease)    Hypercholesteremia    Hypertension    Kidney stone     Past Surgical History:  Procedure Laterality Date   ABDOMINAL HYSTERECTOMY     COLONOSCOPY N/A 08/22/2017   Procedure: COLONOSCOPY;  Surgeon: Danie Binder, MD;  [facility];  Service: Endoscopy;  Laterality: N/A;  9:30   KIDNEY STONE SURGERY     POLYPECTOMY  08/22/2017   Procedure: POLYPECTOMY;  Surgeon: Danie Binder, MD;  [facility];  Service: Endoscopy;;  decending   tumor of ovary removed      There were no vitals filed for this visit.   Subjective Assessment - 08/16/19 1453    Subjective Pt stated she is feeling good today.    Pertinent History HTN, OA    Currently in Pain? No/denies                             [facility] Adult PT Treatment/Exercise - 08/16/19 0001      Posture/Postural Control   Posture/Postural Control Postural limitations    Postural Limitations Rounded Shoulders;Forward head      Exercises   Exercises Neck      Neck Exercises: Theraband   Shoulder Extension 10 reps;Red    Shoulder Extension Limitations 3" holds; 2 sets HEP     Rows 10 reps;Red    Rows Limitations 3" holds; 2 sets HEP      Neck Exercises: Standing   Neck Retraction 10 reps    Upper Extremity Flexion with Stabilization Flexion;10 reps    UE Flexion with Stabilization Limitations chin tuck prior lift    Other Standing Exercises Y lift off 10x 5"      Neck Exercises: Seated   W Back 15 reps    Other Seated Exercise 3D cervical excursin      Manual Therapy   Manual Therapy Soft tissue mobilization    Manual therapy comments Manual complete separate than rest of tx    Soft tissue mobilization Supine: cervical mm with LE elevated on wedge                    PT Short Term Goals - 07/25/19 1151      PT SHORT TERM GOAL #1   Title PT to be I in a HEP to allow pt cervical  pain to be no greater than a 6/10    Time 2    Period Weeks    Status New    Target  Date 08/08/19      PT SHORT TERM GOAL #2   Title Pt cervical rotation to be at least 75 degrees bilaterally to improve safety while driving    Time 2    Period Weeks             PT Long Term Goals - 07/25/19 1153      PT LONG TERM GOAL #1   Title Pt to be I in an advance HEP to increse cervical strength to 5/5 to decrease her cervical  pain to no greater than a 2/10 to allow pt to lift 10# to overhead shelf without increased pain.    Time 4    Period Weeks    Status New    Target Date 08/22/19      PT LONG TERM GOAL #2   Title Pt to be able to verbalize the importance of posture as well as using electronics at eyelevel in keeping cervical pain to a minimum.    Time 2    Period Weeks    Status New                 Plan - 08/16/19 1510    Clinical Impression Statement Progressed postural strengthening with additional standing exercises.  Pt able to demonstrate good form with theraband exercises and given theraband to add to HEP.  Improving mechanics iwth cervical retraction exercises following initial cueing.  EOS with manual soft tissue mobilization to address  cervical mm restrictions to assist with mobility.    Personal Factors and Comorbidities Time since onset of injury/illness/exacerbation    Stability/Clinical Decision Making Stable/Uncomplicated    Clinical Decision Making Low    Rehab Potential Good    PT Frequency 2x / week    PT Duration 4 weeks    PT Treatment/Interventions Patient/family education;Manual techniques;Therapeutic exercise;Ultrasound    PT Next Visit Plan Review form with theraband HEP.  Continue postural strengthening with cervical and scapular retraction, theraband and manual.    PT Home Exercise Plan eval:  cervical and scapular retraction, cervical isometrics for side bending; theraband extension and rows.           Patient will benefit from skilled therapeutic intervention in order to improve the following deficits and impairments:  Decreased range of motion, Pain, Increased muscle spasms  Visit Diagnosis: Cervicalgia     Problem List Patient Active Problem List   Diagnosis Date Noted   Special screening for malignant neoplasms, colon    Ihor Austin, LPTA/CLT; CBIS [phone]  Aldona Lento 08/16/2019, 3:31 PM  [place], [place], [address] Phone: [phone]   Fax:  [phone]  Name: Nancy Woodward MRN: [id number] Date of Birth: 14-Dec-1950

## 2019-08-21 ENCOUNTER — Telehealth: Payer: Self-pay | Admitting: Orthopaedic Surgery

## 2019-08-21 MED ORDER — CYCLOBENZAPRINE HCL 10 MG PO TABS: 10.0000 mg | ORAL_TABLET | Freq: Every day | ORAL | 0 refills | Status: DC

## 2019-08-21 NOTE — Telephone Encounter (Signed)
Patient requests refill on Flexeril 10 mgs.  Qty  30   Sig: Take 1 tablet (10 mg total) by mouth at bedtime. One tablet every night at bedtime as needed for spasm.  Patient states she uses [facility] on [address]

## 2019-08-22 ENCOUNTER — Encounter (HOSPITAL_COMMUNITY): Payer: Self-pay

## 2019-08-22 ENCOUNTER — Other Ambulatory Visit: Payer: Self-pay

## 2019-08-22 ENCOUNTER — Ambulatory Visit (HOSPITAL_COMMUNITY): Payer: Medicare HMO

## 2019-08-22 DIAGNOSIS — M542 Cervicalgia: Secondary | ICD-10-CM

## 2019-08-22 NOTE — Therapy (Signed)
[place], [place], [address] Phone: [phone]   Fax:  [phone]  Physical Therapy Treatment  Patient Details  Name: Nancy Woodward MRN: [id number] Date of Birth: Mar 02, 1950 Referring Provider (PT): Sanjuana Kava    Encounter Date: 08/22/2019   PT End of Session - 08/22/19 1131    Visit Number 4    Number of Visits 8    Date for PT Re-Evaluation 08/24/19    Authorization Type [employer] medicare: requested 8 visits    Progress Note Due on Visit 8    PT Start Time 1133    PT Stop Time 1215    PT Time Calculation (min) 42 min    Activity Tolerance Patient tolerated treatment well    Behavior During Therapy [facility] for tasks assessed/performed           Past Medical History:  Diagnosis Date  . GERD (gastroesophageal reflux disease)   . Hypercholesteremia   . Hypertension   . Kidney stone     Past Surgical History:  Procedure Laterality Date  . ABDOMINAL HYSTERECTOMY    . COLONOSCOPY N/A 08/22/2017   Procedure: COLONOSCOPY;  Surgeon: Danie Binder, MD;  [facility];  Service: Endoscopy;  Laterality: N/A;  9:30  . KIDNEY STONE SURGERY    . POLYPECTOMY  08/22/2017   Procedure: POLYPECTOMY;  Surgeon: Danie Binder, MD;  [facility];  Service: Endoscopy;;  decending  . tumor of ovary removed      There were no vitals filed for this visit.   Subjective Assessment - 08/22/19 1130    Subjective Pt stated she is feeling good today.    Pertinent History HTN, OA             [facility] Adult PT Treatment/Exercise - 08/22/19 0001      Posture/Postural Control   Posture/Postural Control Postural limitations    Postural Limitations Rounded Shoulders;Forward head    Posture Comments cervical right side-bend, left rotation; stand - R iliac crest elevated; supine - left medial malleolus elevated; left leg shorter than right      Neck Exercises: Standing   Neck Retraction 10 reps     Upper Extremity Flexion with Stabilization Flexion;10 reps    UE Flexion with Stabilization Limitations chin tuck prior lift    Other Standing Exercises Y lift off 10x 5"      Neck Exercises: Seated   W Back 15 reps      Neck Exercises: Supine   Neck Retraction 5 reps    Capital Flexion 5 reps    Cervical Rotation Both;15 reps    Cervical Rotation Limitations on pink ball covered with towel      Manual Therapy   Manual Therapy Passive ROM;Soft tissue mobilization    Manual therapy comments Manual complete separate than rest of tx    Soft tissue mobilization Supine: cervical mm with LE elevated on wedge    Passive ROM side bend and rotation             PT Short Term Goals - 08/22/19 1131      PT SHORT TERM GOAL #1   Title PT to be I in a HEP to allow pt cervical  pain to be no greater than a 6/10    Time 2    Period Weeks    Status On-going    Target Date 08/08/19      PT SHORT TERM GOAL #2  Title Pt cervical rotation to be at least 75 degrees bilaterally to improve safety while driving    Time 2    Period Weeks    Status Partially Met             PT Long Term Goals - 08/22/19 1131      PT LONG TERM GOAL #1   Title Pt to be I in an advance HEP to increse cervical strength to 5/5 to decrease her cervical  pain to no greater than a 2/10 to allow pt to lift 10# to overhead shelf without increased pain.    Time 4    Period Weeks    Status On-going      PT LONG TERM GOAL #2   Title Pt to be able to verbalize the importance of posture as well as using electronics at eyelevel in keeping cervical pain to a minimum.    Time 2    Period Weeks    Status On-going             Plan - 08/22/19 1131    Clinical Impression Statement Began session with postural assessment is standing and supine. 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 This noted in standing and sitting. Advise patient to trial 3/8"  heel lift in left shoe. In sitting and standing patient has tendency for head to be positioned in right side bending with slight left rotation. Continued with postural strengthening with additional standing exercises. Soft tissue mobilization for erector spinae, capital muscles, upper trap and scapular muscles; no tenderness noted throughout. Supine cervical rotation over soft ball added. Patient reported 1/10 pain at end of treatment session after therapeutic exercises. Continue with current plan. Progress as able. Follow up on heel lift and patient's ability to independently maintain posture head and neck posturing.    Personal Factors and Comorbidities Time since onset of injury/illness/exacerbation    Stability/Clinical Decision Making Stable/Uncomplicated    Rehab Potential Good    PT Frequency 2x / week    PT Duration 4 weeks    PT Treatment/Interventions Patient/family education;Manual techniques;Therapeutic exercise;Ultrasound    PT Next Visit Plan Review form with theraband HEP.  Continue postural strengthening with cervical and scapular retraction, theraband and manual. Follow up on heel lift and patient's ability to independently maintain posture head and neck posturing.    PT Home Exercise Plan eval:  cervical and scapular retraction, cervical isometrics for side bending; theraband extension and rows; 08/22/19 - heel lift, proper head and neck static posture.           Patient will benefit from skilled therapeutic intervention in order to improve the following deficits and impairments:  Decreased range of motion, Pain, Increased muscle spasms  Visit Diagnosis: Cervicalgia     Problem List Patient Active Problem List   Diagnosis Date Noted  . Special screening for malignant neoplasms, colon     Floria Raveling. Hartnett-Rands, MS, PT Per [place] #[id number] 08/22/2019, 12:35 PM  [place] [address] [place],  [place], [address] Phone: [phone]   Fax:  [phone]  Name: Nancy Woodward MRN: [id number] Date of Birth: [birth year]

## 2019-08-23 ENCOUNTER — Telehealth (HOSPITAL_COMMUNITY): Payer: Self-pay | Admitting: Physical Therapy

## 2019-08-23 ENCOUNTER — Encounter: Payer: Self-pay | Admitting: Orthopaedic Surgery

## 2019-08-23 ENCOUNTER — Ambulatory Visit: Payer: Medicare HMO | Admitting: Orthopaedic Surgery

## 2019-08-23 VITALS — BP 164/101 | HR 86 | Ht 65.0 in | Wt 174.4 lb

## 2019-08-23 DIAGNOSIS — M542 Cervicalgia: Secondary | ICD-10-CM | POA: Diagnosis not present

## 2019-08-23 DIAGNOSIS — F17210 Nicotine dependence, cigarettes, uncomplicated: Secondary | ICD-10-CM | POA: Diagnosis not present

## 2019-08-23 NOTE — Progress Notes (Signed)
Patient Nancy Woodward, female DOB:Oct 28, 1950, 69 y.o. PZW:[id number]  Chief Complaint  Patient presents with  . Neck Pain    Doing better    HPI  Nancy Woodward is a 69 y.o. female who has neck pain.  She has been going to PT.  She had an episode of increased pain last weekend but she is better now.  She is taking her Flexeril and had no numbness.  I have reviewed the PT notes.   Body mass index is 29.02 kg/m.  ROS  Review of Systems  Constitutional: Positive for activity change.  Musculoskeletal: Positive for arthralgias, back pain and myalgias.  All other systems reviewed and are negative.   All other systems reviewed and are negative.  The following is a summary of the past history medically, past history surgically, known current medicines, social history and family history.  This information is gathered electronically by the computer from prior information and documentation.  I review this each visit and have found including this information at this point in the chart is beneficial and informative.    Past Medical History:  Diagnosis Date  . GERD (gastroesophageal reflux disease)   . Hypercholesteremia   . Hypertension   . Kidney stone     Past Surgical History:  Procedure Laterality Date  . ABDOMINAL HYSTERECTOMY    . COLONOSCOPY N/A 08/22/2017   Procedure: COLONOSCOPY;  Surgeon: Danie Binder, MD;  [facility];  Service: Endoscopy;  Laterality: N/A;  9:30  . KIDNEY STONE SURGERY    . POLYPECTOMY  08/22/2017   Procedure: POLYPECTOMY;  Surgeon: Danie Binder, MD;  [facility];  Service: Endoscopy;;  decending  . tumor of ovary removed      Family History  Problem Relation Age of Onset  . Colon polyps Neg Hx   . Colon cancer Neg Hx     Social History Social History   Tobacco Use  . Smoking status: Current Every Day Smoker    Packs/day: 0.25    Types: Cigarettes  . Smokeless tobacco: Never Used  Vaping Use  . Vaping  Use: Never used  Substance Use Topics  . Alcohol use: No  . Drug use: No    Allergies  Allergen Reactions  . Augmentin [Amoxicillin-Pot Clavulanate] Nausea And Vomiting    Has patient had a PCN reaction causing immediate rash, facial/tongue/throat swelling, SOB or lightheadedness with hypotension: No Has patient had a PCN reaction causing severe rash involving mucus membranes or skin necrosis: No Has patient had a PCN reaction that required hospitalization: No Has patient had a PCN reaction occurring within the last 10 years: No If all of the above answers are "NO", then may proceed with Cephalosporin use.     Current Outpatient Medications  Medication Sig Dispense Refill  . aspirin EC 81 MG tablet Take 81 mg by mouth daily.    Marland Kitchen aspirin-acetaminophen-caffeine (EXCEDRIN MIGRAINE) 250-250-65 MG tablet Take 2 tablets by mouth daily as needed for headache.     . cyclobenzaprine (FLEXERIL) 10 MG tablet Take 1 tablet (10 mg total) by mouth at bedtime. One tablet every night at bedtime as needed for spasm. 30 tablet 0  . hydrochlorothiazide (HYDRODIURIL) 25 MG tablet Take 25 mg by mouth daily.   0  . lisinopril (PRINIVIL,ZESTRIL) 40 MG tablet Take 40 mg by mouth daily.    . metFORMIN (GLUCOPHAGE) 500 MG tablet Take 500 mg by mouth 2 (two) times daily with a meal.    .  Multiple Vitamin (MULTIVITAMIN WITH MINERALS) TABS tablet Take 1 tablet by mouth daily.    . ranitidine (ZANTAC) 150 MG tablet Take 150 mg by mouth daily as needed for heartburn.     . rosuvastatin (CRESTOR) 20 MG tablet Take 1 tablet (20 mg total) by mouth daily. 90 tablet 3  . rosuvastatin (CRESTOR) 40 MG tablet Take 40 mg by mouth daily.     No current facility-administered medications for this visit.     Physical Exam  Blood pressure (!) 164/101, pulse 86, height 5\' 5"  (1.651 m), weight 174 lb 6 oz (79.1 kg).  Constitutional: overall normal hygiene, normal nutrition, well developed, normal grooming, normal body  habitus. Assistive device:none  Musculoskeletal: gait and station Limp none, muscle tone and strength are normal, no tremors or atrophy is present.  .  Neurological: coordination overall normal.  Deep tendon reflex/nerve stretch intact.  Sensation normal.  Cranial nerves II-XII intact.   Skin:   Normal overall no scars, lesions, ulcers or rashes. No psoriasis.  Psychiatric: Alert and oriented x 3.  Recent memory intact, remote memory unclear.  Normal mood and affect. Well groomed.  Good eye contact.  Cardiovascular: overall no swelling, no varicosities, no edema bilaterally, normal temperatures of the legs and arms, no clubbing, cyanosis and good capillary refill.  Lymphatic: palpation is normal.  Her neck has full motion today.  NV intact.  All other systems reviewed and are negative   The patient has been educated about the nature of the problem(s) and counseled on treatment options.  The patient appeared to understand what I have discussed and is in agreement with it.  Encounter Diagnoses  Name Primary?  . Cervicalgia Yes  . Nicotine dependence, cigarettes, uncomplicated     PLAN Call if any problems.  Precautions discussed.  Continue current medications.   Return to clinic 3 weeks   Electronically Signed Sanjuana Kava, MD 7/22/20218:22 AM

## 2019-08-23 NOTE — Telephone Encounter (Signed)
pt cancelled appt for 7/23 because she has a conflicting appt

## 2019-08-23 NOTE — Patient Instructions (Signed)

## 2019-08-24 ENCOUNTER — Encounter (HOSPITAL_COMMUNITY): Payer: Medicare HMO | Admitting: Physical Therapy

## 2019-08-28 ENCOUNTER — Ambulatory Visit (HOSPITAL_COMMUNITY): Payer: Medicare HMO | Admitting: Physical Therapy

## 2019-08-28 ENCOUNTER — Other Ambulatory Visit: Payer: Self-pay

## 2019-08-28 ENCOUNTER — Encounter (HOSPITAL_COMMUNITY): Payer: Self-pay | Admitting: Physical Therapy

## 2019-08-28 DIAGNOSIS — M542 Cervicalgia: Secondary | ICD-10-CM

## 2019-08-28 NOTE — Therapy (Signed)
[place], [place], [address] Phone: [phone]   Fax:  [phone]  Physical Therapy Treatment  Patient Details  Name: Nancy Woodward MRN: [id number] Date of Birth: [birth year] Referring Provider (PT): Sanjuana Kava    Encounter Date: 08/28/2019   PT End of Session - 08/28/19 1500    Visit Number 5    Number of Visits 8    Date for PT Re-Evaluation 08/24/19    Authorization Type [employer] medicare: PT out of cert  requested 8  more visits    Progress Note Due on Visit 8    PT Start Time 1638    PT Stop Time 1530    PT Time Calculation (min) 45 min    Activity Tolerance Patient tolerated treatment well    Behavior During Therapy [facility] for tasks assessed/performed           Past Medical History:  Diagnosis Date  . GERD (gastroesophageal reflux disease)   . Hypercholesteremia   . Hypertension   . Kidney stone     Past Surgical History:  Procedure Laterality Date  . ABDOMINAL HYSTERECTOMY    . COLONOSCOPY N/A 08/22/2017   Procedure: COLONOSCOPY;  Surgeon: Danie Binder, MD;  [facility];  Service: Endoscopy;  Laterality: N/A;  9:30  . KIDNEY STONE SURGERY    . POLYPECTOMY  08/22/2017   Procedure: POLYPECTOMY;  Surgeon: Danie Binder, MD;  [facility];  Service: Endoscopy;;  decending  . tumor of ovary removed      There were no vitals filed for this visit.   Subjective Assessment - 08/28/19 1454    Subjective PT is completing her exercises daily she has no question.  She has not been able to come to therapy on a consistent basis due to various reasons.     Pertinent History HTN, OA    How long can you sit comfortably? no problem    How long can you stand comfortably? does not affect    How long can you walk comfortably? does not affect    Diagnostic tests x ray    Patient Stated Goals no pain    Currently in Pain? No/denies   in the past week the worst pain she has had was  a 9   Pain Onset More than a month ago              [facility] PT Assessment - 08/28/19 0001      Assessment   Medical Diagnosis Cervicalgia     Referring Provider (PT) Sanjuana Kava     Onset Date/Surgical Date 11/16/18    Next MD Visit 08/02/2019    Prior Therapy none       Precautions   Precautions None      [place] residence      Prior Function   Level of Independence Independent      Cognition   Overall Cognitive Status Within Functional Limits for tasks assessed      Observation/Other Assessments   Focus on Therapeutic Outcomes (FOTO)  62;38% affected vs initial eval 67; 33% affected       Posture/Postural Control   Posture/Postural Control Postural limitations    Postural Limitations Rounded Shoulders;Forward head      AROM   Cervical Flexion wfl    Cervical Extension wfl    Cervical - Right Side Bend 40   was 40    Cervical -  Left Side Bend 30   was 40    Cervical - Right Rotation 65   was 65   Cervical - Left Rotation 65   was 65     Strength   Cervical Extension 5/5    Cervical - Right Side Bend 4+/5   was 4+   Cervical - Left Side Bend 4-/5   was 4-     Palpation   Palpation comment --                         [facility] Adult PT Treatment/Exercise - 08/28/19 0001      Exercises   Exercises Neck      Neck Exercises: Seated   Cervical Isometrics Extension;Right lateral flexion;Left lateral flexion;3 secs;5 reps    Shoulder Rolls Backwards;10 reps    Other Seated Exercise 3D cervical excursin      Manual Therapy   Manual Therapy Joint mobilization;Soft tissue mobilization;Passive ROM;Manual Traction    Manual therapy comments Manual complete separate than rest of tx    Joint Mobilization to increase rotation and sidebend    Manual Traction 3 x 30" to reduce tension                   PT Education - 08/28/19 1533    Education Details HEP for excursions and shoulder rolls            PT  Short Term Goals - 08/28/19 1503      PT SHORT TERM GOAL #1   Title PT to be I in a HEP to allow pt cervical  pain to be no greater than a 6/10    Time 2    Period Weeks    Status Partially Met    Target Date 08/08/19      PT SHORT TERM GOAL #2   Title Pt cervical rotation to be at least 75 degrees bilaterally to improve safety while driving    Time 2    Period Weeks    Status Not Met             PT Long Term Goals - 08/28/19 1504      PT LONG TERM GOAL #1   Title Pt to be I in an advance HEP to increse cervical strength to 5/5 to decrease her cervical  pain to no greater than a 2/10 to allow pt to lift 10# to overhead shelf without increased pain.    Time 4    Period Weeks    Status On-going      PT LONG TERM GOAL #2   Title Pt to be able to verbalize the importance of posture as well as using electronics at eyelevel in keeping cervical pain to a minimum.    Time 4    Period Weeks    Status Achieved                 Plan - 08/28/19 1506    Clinical Impression Statement PT reassessed this visit as her authorization date has expired. Pt has had to cancel treatments due to various reasons.  Pt feels therapy feels that she has improved 20% since starting therapy although foto and objective have not significantly changed.  Manual demonstrates increased spasms in B upper traps with tightness at end range.  Pt will continue to benefit from skilled PT to obtain funtional motion and decrease pain.    Personal Factors and Comorbidities Time since onset of  injury/illness/exacerbation    Examination-Activity Limitations Carry;Lift;Reach Overhead    Examination-Participation Restrictions Cleaning;Laundry    Stability/Clinical Decision Making Stable/Uncomplicated    Clinical Decision Making Low    Rehab Potential Good    PT Frequency 2x / week    PT Duration 6 weeks    PT Treatment/Interventions Patient/family education;Manual techniques;Therapeutic exercise;Ultrasound    PT  Next Visit Plan Review form with theraband HEP.  Continue postural strengthening with cervical and scapular retraction, theraband and manual. Follow up on heel lift and patient's ability to independently maintain posture head and neck posturing.    PT Home Exercise Plan eval:  cervical and scapular retraction, cervical isometrics for side bending; theraband extension and rows; 08/22/19 - heel lift, proper head and neck static posture.; 7/27: cervical excursions; shoulder circles           Patient will benefit from skilled therapeutic intervention in order to improve the following deficits and impairments:  Decreased range of motion, Pain, Increased muscle spasms  Visit Diagnosis: Cervicalgia     Problem List Patient Active Problem List   Diagnosis Date Noted  . Special screening for malignant neoplasms, colon    Rayetta Humphrey, PT CLT [phone] 08/28/2019, 3:42 PM  [place] [address] [place], [place], [address] Phone: [phone]   Fax:  [phone]  Name: Deaundra Dupriest MRN: [id number] Date of Birth: 12-25-1950

## 2019-08-29 NOTE — Addendum Note (Signed)
Addended by: Leeroy Cha on: 08/29/2019 05:16 PM   Modules accepted: Orders

## 2019-08-30 ENCOUNTER — Encounter (HOSPITAL_COMMUNITY): Payer: Self-pay

## 2019-08-30 ENCOUNTER — Other Ambulatory Visit: Payer: Self-pay

## 2019-08-30 ENCOUNTER — Ambulatory Visit (HOSPITAL_COMMUNITY): Payer: Medicare HMO

## 2019-08-30 DIAGNOSIS — M542 Cervicalgia: Secondary | ICD-10-CM

## 2019-08-30 NOTE — Therapy (Signed)
[place], [place], [address] Phone: [phone]   Fax:  [phone]  Physical Therapy Treatment  Patient Details  Name: Nancy Woodward MRN: [id number] Date of Birth: [birth year] Referring Provider (PT): Sanjuana Kava    Encounter Date: 08/30/2019   PT End of Session - 08/30/19 1409    Visit Number 6    Number of Visits 9    Date for PT Re-Evaluation 09/19/19    Authorization Type [employer] medicare:    Authorization Time Period approved for 4 visits from 7/28--09/17/19    Authorization - Visit Number 1    Authorization - Number of Visits 4    Progress Note Due on Visit 9    PT Start Time 4580    PT Stop Time 1441    PT Time Calculation (min) 38 min    Activity Tolerance Patient tolerated treatment well    Behavior During Therapy [facility] for tasks assessed/performed           Past Medical History:  Diagnosis Date  . GERD (gastroesophageal reflux disease)   . Hypercholesteremia   . Hypertension   . Kidney stone     Past Surgical History:  Procedure Laterality Date  . ABDOMINAL HYSTERECTOMY    . COLONOSCOPY N/A 08/22/2017   Procedure: COLONOSCOPY;  Surgeon: Danie Binder, MD;  [facility];  Service: Endoscopy;  Laterality: N/A;  9:30  . KIDNEY STONE SURGERY    . POLYPECTOMY  08/22/2017   Procedure: POLYPECTOMY;  Surgeon: Danie Binder, MD;  [facility];  Service: Endoscopy;;  decending  . tumor of ovary removed      There were no vitals filed for this visit.   Subjective Assessment - 08/30/19 1406    Subjective Neck is feeling good, has a mirgraine pain scale 3/10.  Reports she has not purchased heel lift yet, no questions concerning.    Pertinent History HTN, OA    Currently in Pain? Yes   headache 3/10   Pain Score 3     Pain Location Head    Pain Descriptors / Indicators Aching    Pain Onset More than a month ago    Pain Frequency Intermittent    Aggravating Factors   not sure    Pain Relieving Factors mm relaxor    Effect of Pain on Daily Activities Pt just keeps going                             [facility] Adult PT Treatment/Exercise - 08/30/19 0001      Posture/Postural Control   Posture/Postural Control Postural limitations    Postural Limitations Rounded Shoulders;Forward head      Exercises   Exercises Neck      Neck Exercises: Theraband   Scapula Retraction 10 reps;Green    Scapula Retraction Limitations cueing to reduce shoulder elevation    Shoulder Extension 15 reps;Green    Shoulder Extension Limitations 5" holds, able to demonstrate good mechanics    Rows 15 reps;Green    Rows Limitations 5" holds, able to demonstrate good mechanics      Neck Exercises: Standing   Neck Retraction 10 reps    Upper Extremity Flexion with Stabilization Flexion;10 reps    UE Flexion with Stabilization Limitations chin tuck prior lift    Other Standing Exercises Y lift off 10x 5"      Neck Exercises: Seated  Cervical Isometrics Right rotation;Left rotation;Right lateral flexion;Left lateral flexion;5 reps;5 secs    W Back 15 reps    Other Seated Exercise 3D cervical excursin- reviewed HEP      Manual Therapy   Manual Therapy Soft tissue mobilization    Manual therapy comments Manual complete separate than rest of tx    Soft tissue mobilization prone: cervial mm, UT                    PT Short Term Goals - 08/28/19 1503      PT SHORT TERM GOAL #1   Title PT to be I in a HEP to allow pt cervical  pain to be no greater than a 6/10    Time 2    Period Weeks    Status Partially Met    Target Date 08/08/19      PT SHORT TERM GOAL #2   Title Pt cervical rotation to be at least 75 degrees bilaterally to improve safety while driving    Time 2    Period Weeks    Status Not Met             PT Long Term Goals - 08/28/19 1504      PT LONG TERM GOAL #1   Title Pt to be I in an advance HEP to increse cervical strength  to 5/5 to decrease her cervical  pain to no greater than a 2/10 to allow pt to lift 10# to overhead shelf without increased pain.    Time 4    Period Weeks    Status On-going      PT LONG TERM GOAL #2   Title Pt to be able to verbalize the importance of posture as well as using electronics at eyelevel in keeping cervical pain to a minimum.    Time 4    Period Weeks    Status Achieved                 Plan - 08/30/19 1830    Clinical Impression Statement Continued session focus on postural strengthening and ability to maintain posture outside of static standing.  Pt able to demonstrate good mechanics with rows and shoulder extension theraband, added scapular retraciton with some cueng to reduce UT compensation and initiate scapular mm.  EOS with STM to address tightness in upper trap wiht reports of relief following.  Pt reports she has not purchased heel lift, does have plans to.    Personal Factors and Comorbidities Time since onset of injury/illness/exacerbation    Examination-Activity Limitations Carry;Lift;Reach Overhead    Examination-Participation Restrictions Cleaning;Laundry    Stability/Clinical Decision Making Stable/Uncomplicated    Clinical Decision Making Low    Rehab Potential Good    PT Frequency 2x / week    PT Duration 6 weeks    PT Treatment/Interventions Patient/family education;Manual techniques;Therapeutic exercise;Ultrasound    PT Next Visit Plan Continue postural strengthening with cervical and scapular retraction, theraband and manual. Follow up on heel lift and patient's ability to independently maintain posture head and neck posturing.    PT Home Exercise Plan eval:  cervical and scapular retraction, cervical isometrics for side bending; theraband extension and rows; 08/22/19 - heel lift, proper head and neck static posture.; 7/27: cervical excursions; shoulder circles           Patient will benefit from skilled therapeutic intervention in order to  improve the following deficits and impairments:  Decreased range of motion, Pain, Increased muscle  spasms  Visit Diagnosis: Cervicalgia     Problem List Patient Active Problem List   Diagnosis Date Noted  . Special screening for malignant neoplasms, colon    Ihor Austin, LPTA/CLT; CBIS [phone]  Aldona Lento 08/30/2019, 6:40 PM  [place], [place], [address] Phone: [phone]   Fax:  [phone]  Name: Taiz Bickle MRN: [id number] Date of Birth: 06/22/1950

## 2019-09-05 ENCOUNTER — Other Ambulatory Visit: Payer: Self-pay

## 2019-09-05 ENCOUNTER — Ambulatory Visit (HOSPITAL_COMMUNITY): Payer: Medicare HMO | Attending: Orthopaedic Surgery | Admitting: Physical Therapy

## 2019-09-05 DIAGNOSIS — M542 Cervicalgia: Secondary | ICD-10-CM | POA: Diagnosis present

## 2019-09-05 NOTE — Therapy (Signed)
[place], [place], [address] Phone: [phone]   Fax:  [phone]  Physical Therapy Treatment  Patient Details  Name: Nancy Woodward MRN: [id number] Date of Birth: 03-16-1950 Referring Provider (PT): Sanjuana Kava    Encounter Date: 09/05/2019   PT End of Session - 09/05/19 1220    Visit Number 7    Number of Visits 9    Date for PT Re-Evaluation 09/19/19    Authorization Type [employer] medicare:    Authorization Time Period approved for 4 visits from 7/28--09/17/19    Authorization - Visit Number 2    Authorization - Number of Visits 4    Progress Note Due on Visit 9    PT Start Time 1138    PT Stop Time 1218    PT Time Calculation (min) 40 min    Activity Tolerance Patient tolerated treatment well    Behavior During Therapy [facility] for tasks assessed/performed           Past Medical History:  Diagnosis Date  . GERD (gastroesophageal reflux disease)   . Hypercholesteremia   . Hypertension   . Kidney stone     Past Surgical History:  Procedure Laterality Date  . ABDOMINAL HYSTERECTOMY    . COLONOSCOPY N/A 08/22/2017   Procedure: COLONOSCOPY;  Surgeon: Danie Binder, MD;  [facility];  Service: Endoscopy;  Laterality: N/A;  9:30  . KIDNEY STONE SURGERY    . POLYPECTOMY  08/22/2017   Procedure: POLYPECTOMY;  Surgeon: Danie Binder, MD;  [facility];  Service: Endoscopy;;  decending  . tumor of ovary removed      There were no vitals filed for this visit.   Subjective Assessment - 09/05/19 1139    Subjective Neck is feeling good, has a mirgraine pain scale 3/10.  Reports she has not purchased heel lift yet, no questions concerning.    Pertinent History HTN, OA    Currently in Pain? Yes    Pain Score 1     Pain Location Neck    Pain Orientation Right;Left    Pain Descriptors / Indicators Aching    Pain Type Chronic pain    Pain Onset More than a month ago    Pain  Frequency Intermittent    Aggravating Factors  not sure    Pain Relieving Factors ointment    Effect of Pain on Daily Activities none                             [facility] Adult PT Treatment/Exercise - 09/05/19 0001      Posture/Postural Control   Posture/Postural Control Postural limitations    Postural Limitations Rounded Shoulders;Forward head      Exercises   Exercises Neck      Neck Exercises: Theraband   Scapula Retraction 10 reps;Green    Scapula Retraction Limitations cueing to reduce shoulder elevation    Shoulder Extension 15 reps;Green    Shoulder Extension Limitations 5" holds, able to demonstrate good mechanics    Rows 15 reps;Green    Rows Limitations 5" holds, able to demonstrate good mechanics      Neck Exercises: Standing   Neck Retraction 10 reps    Upper Extremity Flexion with Stabilization Flexion;10 reps    UE Flexion with Stabilization Limitations chin tuck prior lift    Other Standing Exercises Y lift off 10x 5"  Neck Exercises: Seated   Cervical Isometrics Right rotation;Left rotation;Right lateral flexion;Left lateral flexion;5 reps;5 secs    W Back 15 reps    W Back Weights (lbs) 2    Shoulder Rolls Backwards;10 reps    Other Seated Exercise 3D cervical excursin- reviewed HEP    Other Seated Exercise  scapular retraciotn      Manual Therapy   Manual Therapy Soft tissue mobilization;Manual Traction    Manual therapy comments Manual complete separate than rest of tx    Soft tissue mobilization efflurage/pettrisage techniques to decrase tightness and pain     Manual Traction 5x 20"                    PT Short Term Goals - 08/28/19 1503      PT SHORT TERM GOAL #1   Title PT to be I in a HEP to allow pt cervical  pain to be no greater than a 6/10    Time 2    Period Weeks    Status Partially Met    Target Date 08/08/19      PT SHORT TERM GOAL #2   Title Pt cervical rotation to be at least 75 degrees bilaterally  to improve safety while driving    Time 2    Period Weeks    Status Not Met             PT Long Term Goals - 08/28/19 1504      PT LONG TERM GOAL #1   Title Pt to be I in an advance HEP to increse cervical strength to 5/5 to decrease her cervical  pain to no greater than a 2/10 to allow pt to lift 10# to overhead shelf without increased pain.    Time 4    Period Weeks    Status On-going      PT LONG TERM GOAL #2   Title Pt to be able to verbalize the importance of posture as well as using electronics at eyelevel in keeping cervical pain to a minimum.    Time 4    Period Weeks    Status Achieved                 Plan - 09/05/19 1221    Clinical Impression Statement Pt needs verbal and tactile cuing to perform exercises correctly, noted improvement in mm spasm and pain in cervical area.  Added 2# wt to W back to strengthen postural mm.    Personal Factors and Comorbidities Time since onset of injury/illness/exacerbation    Examination-Activity Limitations Carry;Lift;Reach Overhead    Examination-Participation Restrictions Cleaning;Laundry    Stability/Clinical Decision Making Stable/Uncomplicated    Rehab Potential Good    PT Frequency 2x / week    PT Duration 6 weeks    PT Treatment/Interventions Patient/family education;Manual techniques;Therapeutic exercise;Ultrasound    PT Next Visit Plan Continue to stress proper cervical stabilization with exercises to ensure pt is completing HEP properly    PT Home Exercise Plan eval:  cervical and scapular retraction, cervical isometrics for side bending; theraband extension and rows; 08/22/19 - heel lift, proper head and neck static posture.; 7/27: cervical excursions; shoulder circles           Patient will benefit from skilled therapeutic intervention in order to improve the following deficits and impairments:  Decreased range of motion, Pain, Increased muscle spasms  Visit Diagnosis: Cervicalgia     Problem  List Patient Active Problem List  Diagnosis Date Noted  . Special screening for malignant neoplasms, colon   Rayetta Humphrey, PT CLT [phone] 09/05/2019, 12:23 PM  [place] [address] [place], [place], [address] Phone: [phone]   Fax:  [phone]  Name: Nancy Woodward MRN: [id number] Date of Birth: [birth year]

## 2019-09-13 ENCOUNTER — Other Ambulatory Visit: Payer: Self-pay

## 2019-09-13 ENCOUNTER — Encounter: Payer: Self-pay | Admitting: Orthopaedic Surgery

## 2019-09-13 ENCOUNTER — Ambulatory Visit: Payer: Medicare HMO | Admitting: Orthopaedic Surgery

## 2019-09-13 VITALS — BP 146/94 | HR 91 | Ht 66.0 in | Wt 171.0 lb

## 2019-09-13 DIAGNOSIS — F17210 Nicotine dependence, cigarettes, uncomplicated: Secondary | ICD-10-CM

## 2019-09-13 DIAGNOSIS — M542 Cervicalgia: Secondary | ICD-10-CM | POA: Diagnosis not present

## 2019-09-13 MED ORDER — CYCLOBENZAPRINE HCL 10 MG PO TABS: 10.0000 mg | ORAL_TABLET | Freq: Every day | ORAL | 0 refills | Status: DC

## 2019-09-13 NOTE — Progress Notes (Signed)
Patient EX:Nancy Woodward, female DOB:04-02-1950, 69 y.o. CVE:[id number]  Chief Complaint  Patient presents with  . Neck Pain    HPI  Nancy Woodward is a 69 y.o. female who has neck pain. She has been going to PT and is slowly getting better.  She is pleased.  She has no new trauma, no numbness.  She is doing the exercises at home.   Body mass index is 27.6 kg/m.  ROS  Review of Systems  Constitutional: Positive for activity change.  Musculoskeletal: Positive for arthralgias, back pain and myalgias.  All other systems reviewed and are negative.   All other systems reviewed and are negative.  The following is a summary of the past history medically, past history surgically, known current medicines, social history and family history.  This information is gathered electronically by the computer from prior information and documentation.  I review this each visit and have found including this information at this point in the chart is beneficial and informative.    Past Medical History:  Diagnosis Date  . GERD (gastroesophageal reflux disease)   . Hypercholesteremia   . Hypertension   . Kidney stone     Past Surgical History:  Procedure Laterality Date  . ABDOMINAL HYSTERECTOMY    . COLONOSCOPY N/A 08/22/2017   Procedure: COLONOSCOPY;  Surgeon: Danie Binder, MD;  [facility];  Service: Endoscopy;  Laterality: N/A;  9:30  . KIDNEY STONE SURGERY    . POLYPECTOMY  08/22/2017   Procedure: POLYPECTOMY;  Surgeon: Danie Binder, MD;  [facility];  Service: Endoscopy;;  decending  . tumor of ovary removed      Family History  Problem Relation Age of Onset  . Colon polyps Neg Hx   . Colon cancer Neg Hx     Social History Social History   Tobacco Use  . Smoking status: Current Some Day Smoker    Packs/day: 0.25    Types: Cigarettes  . Smokeless tobacco: Never Used  Vaping Use  . Vaping Use: Never used  Substance Use Topics  . Alcohol use:  No  . Drug use: No    Allergies  Allergen Reactions  . Augmentin [Amoxicillin-Pot Clavulanate] Nausea And Vomiting    Has patient had a PCN reaction causing immediate rash, facial/tongue/throat swelling, SOB or lightheadedness with hypotension: No Has patient had a PCN reaction causing severe rash involving mucus membranes or skin necrosis: No Has patient had a PCN reaction that required hospitalization: No Has patient had a PCN reaction occurring within the last 10 years: No If all of the above answers are "NO", then may proceed with Cephalosporin use.     Current Outpatient Medications  Medication Sig Dispense Refill  . aspirin EC 81 MG tablet Take 81 mg by mouth daily.    Marland Kitchen aspirin-acetaminophen-caffeine (EXCEDRIN MIGRAINE) 250-250-65 MG tablet Take 2 tablets by mouth daily as needed for headache.     . cyclobenzaprine (FLEXERIL) 10 MG tablet Take 1 tablet (10 mg total) by mouth at bedtime. One tablet every night at bedtime as needed for spasm. 30 tablet 0  . hydrochlorothiazide (HYDRODIURIL) 25 MG tablet Take 25 mg by mouth daily.   0  . lisinopril (PRINIVIL,ZESTRIL) 40 MG tablet Take 40 mg by mouth daily.    . metFORMIN (GLUCOPHAGE) 500 MG tablet Take 500 mg by mouth 2 (two) times daily with a meal.    . Multiple Vitamin (MULTIVITAMIN WITH MINERALS) TABS tablet Take 1 tablet by mouth daily.    Marland Kitchen  ranitidine (ZANTAC) 150 MG tablet Take 150 mg by mouth daily as needed for heartburn.     . rosuvastatin (CRESTOR) 20 MG tablet Take 1 tablet (20 mg total) by mouth daily. 90 tablet 3  . rosuvastatin (CRESTOR) 40 MG tablet Take 40 mg by mouth daily.     No current facility-administered medications for this visit.     Physical Exam  Blood pressure (!) 146/94, pulse 91, height 5\' 6"  (1.676 m), weight 171 lb (77.6 kg).  Constitutional: overall normal hygiene, normal nutrition, well developed, normal grooming, normal body habitus. Assistive device:none  Musculoskeletal: gait and  station Limp none, muscle tone and strength are normal, no tremors or atrophy is present.  .  Neurological: coordination overall normal.  Deep tendon reflex/nerve stretch intact.  Sensation normal.  Cranial nerves II-XII intact.   Skin:   Normal overall no scars, lesions, ulcers or rashes. No psoriasis.  Psychiatric: Alert and oriented x 3.  Recent memory intact, remote memory unclear.  Normal mood and affect. Well groomed.  Good eye contact.  Cardiovascular: overall no swelling, no varicosities, no edema bilaterally, normal temperatures of the legs and arms, no clubbing, cyanosis and good capillary refill.  Lymphatic: palpation is normal.  Neck has full ROM, no spasm.   All other systems reviewed and are negative   The patient has been educated about the nature of the problem(s) and counseled on treatment options.  The patient appeared to understand what I have discussed and is in agreement with it.  Encounter Diagnoses  Name Primary?  . Cervicalgia Yes  . Nicotine dependence, cigarettes, uncomplicated     PLAN Call if any problems.  Precautions discussed.  Continue current medications.   Return to clinic 1 month   Continue PT.  I have refilled the Flexeril.  Electronically Signed Sanjuana Kava, MD 8/12/20218:39 AM

## 2019-09-17 ENCOUNTER — Other Ambulatory Visit: Payer: Self-pay

## 2019-09-17 ENCOUNTER — Ambulatory Visit (HOSPITAL_COMMUNITY): Payer: Medicare HMO | Admitting: Physical Therapy

## 2019-09-17 DIAGNOSIS — M542 Cervicalgia: Secondary | ICD-10-CM | POA: Diagnosis not present

## 2019-09-17 NOTE — Therapy (Signed)
[facility] [place], [place], [address] Phone: [phone]   Fax:  [phone]  Physical Therapy Treatment  Patient Details  Name: Nancy Woodward MRN: [id number] Date of Birth: February 10, 1950 Referring Provider (PT): Sanjuana Kava    Encounter Date: 09/17/2019   PHYSICAL THERAPY DISCHARGE SUMMARY  Visits from Start of Care: 8  Current functional level related to goals / functional outcome All met except strength is at 4+ and occasionally has 3/10 pain   Remaining deficits: occacional 3/10 pain    Education / Equipment: Posture; HEP  Plan: Patient agrees to discharge.  Patient goals were not met. Patient is being discharged due to meeting the stated rehab goals.  ?????      PT End of Session - 09/17/19 0841    Visit Number 8    Number of Visits 8    Date for PT Re-Evaluation 09/19/19    Authorization Type [employer] medicare:    Authorization Time Period approved for 4 visits from 7/28--09/17/19    Authorization - Visit Number 2    Authorization - Number of Visits 8    Progress Note Due on Visit 8    Activity Tolerance Patient tolerated treatment well    Behavior During Therapy [facility] for tasks assessed/performed           Past Medical History:  Diagnosis Date  . GERD (gastroesophageal reflux disease)   . Hypercholesteremia   . Hypertension   . Kidney stone     Past Surgical History:  Procedure Laterality Date  . ABDOMINAL HYSTERECTOMY    . COLONOSCOPY N/A 08/22/2017   Procedure: COLONOSCOPY;  Surgeon: Danie Binder, MD;  [facility];  Service: Endoscopy;  Laterality: N/A;  9:30  . KIDNEY STONE SURGERY    . POLYPECTOMY  08/22/2017   Procedure: POLYPECTOMY;  Surgeon: Danie Binder, MD;  [facility];  Service: Endoscopy;;  decending  . tumor of ovary removed      There were no vitals filed for this visit.   Subjective Assessment - 09/17/19 0846    Subjective PT feels that she is  30% better; she has no pain, her highest pain has been 3/10.    Pertinent History HTN, OA    How long can you sit comfortably? no problem    How long can you stand comfortably? does not affect    How long can you walk comfortably? does not affect    Diagnostic tests x ray    Patient Stated Goals no pain    Currently in Pain? No/denies    Pain Onset More than a month ago              [facility] PT Assessment - 09/17/19 0001      Assessment   Medical Diagnosis Cervicalgia     Referring Provider (PT) Sanjuana Kava     Onset Date/Surgical Date 11/16/18    Next MD Visit 10/16/2019    Prior Therapy none       Precautions   Precautions None      Home Environment   Living Environment Private residence      Prior Function   Level of Independence Independent      Cognition   Overall Cognitive Status Within Functional Limits for tasks assessed      Observation/Other Assessments   Focus on Therapeutic Outcomes (FOTO)  67 % affected remains 67% affectec       Posture/Postural Control  Posture/Postural Control Postural limitations    Postural Limitations Rounded Shoulders;Forward head      AROM   Cervical Flexion wfl    Cervical Extension wfl    Cervical - Right Side Bend 45   was 40    Cervical - Left Side Bend 40   was 40    Cervical - Right Rotation 70   was 70   Cervical - Left Rotation 80   was 65     Strength   Cervical Extension 5/5    Cervical - Right Side Bend 4+/5   was 4+   Cervical - Left Side Bend 4+/5   was 4-                                  PT Short Term Goals - 09/17/19 0850      PT SHORT TERM GOAL #1   Title PT to be I in a HEP to allow pt cervical  pain to be no greater than a 6/10    Time 2    Period Weeks    Status Achieved    Target Date 08/08/19      PT SHORT TERM GOAL #2   Title Pt cervical rotation to be at least 75 degrees bilaterally to improve safety while driving    Time 2    Period Weeks    Status Achieved               PT Long Term Goals - 09/17/19 7494      PT LONG TERM GOAL #1   Title Pt to be I in an advance HEP to increse cervical strength to 5/5 to decrease her cervical  pain to no greater than a 2/10 to allow pt to lift 10# to overhead shelf without increased pain.    Status On-going      PT LONG TERM GOAL #2   Title Pt to be able to verbalize the importance of posture as well as using electronics at eyelevel in keeping cervical pain to a minimum.    Status Achieved                 Plan - 09/17/19 4967    Clinical Impression Statement PT is I in all exercises.  She is in no pain today and feels ready for discharge at this time.  ROM and strength are functional    Personal Factors and Comorbidities Time since onset of injury/illness/exacerbation    Examination-Activity Limitations Carry;Lift;Reach Overhead    Examination-Participation Restrictions Cleaning;Laundry    Stability/Clinical Decision Making Stable/Uncomplicated    Rehab Potential Good    PT Frequency 2x / week    PT Duration 6 weeks    PT Treatment/Interventions Patient/family education;Manual techniques;Therapeutic exercise;Ultrasound    PT Next Visit Plan Discharge.    PT Home Exercise Plan eval:  cervical and scapular retraction, cervical isometrics for side bending; theraband extension and rows; 08/22/19 - heel lift, proper head and neck static posture.; 7/27: cervical excursions; shoulder circles           Patient will benefit from skilled therapeutic intervention in order to improve the following deficits and impairments:  Decreased range of motion, Pain, Increased muscle spasms  Visit Diagnosis: Cervicalgia     Problem List Patient Active Problem List   Diagnosis Date Noted  . Special screening for malignant neoplasms, colon     Rayetta Humphrey, PT CLT [phone]  09/17/2019, 8:57 AM  [facility] [address] [place], [place], [address] Phone:  [phone]   Fax:  [phone]  Name: Nancy Woodward MRN: [id number] Date of Birth: 11-28-1950

## 2019-09-22 ENCOUNTER — Other Ambulatory Visit: Payer: Self-pay

## 2019-09-22 ENCOUNTER — Emergency Department (HOSPITAL_COMMUNITY)
Admission: EM | Admit: 2019-09-22 | Discharge: 2019-09-22 | Disposition: A | Discharge status: Home / Self Care | Payer: Medicare HMO | Attending: Emergency Medicine | Admitting: Emergency Medicine

## 2019-09-22 ENCOUNTER — Encounter (HOSPITAL_COMMUNITY): Payer: Self-pay | Admitting: Emergency Medicine

## 2019-09-22 DIAGNOSIS — F17210 Nicotine dependence, cigarettes, uncomplicated: Secondary | ICD-10-CM | POA: Diagnosis not present

## 2019-09-22 DIAGNOSIS — Z79899 Other long term (current) drug therapy: Secondary | ICD-10-CM | POA: Diagnosis not present

## 2019-09-22 DIAGNOSIS — Z7982 Long term (current) use of aspirin: Secondary | ICD-10-CM | POA: Insufficient documentation

## 2019-09-22 DIAGNOSIS — I10 Essential (primary) hypertension: Secondary | ICD-10-CM | POA: Insufficient documentation

## 2019-09-22 DIAGNOSIS — M542 Cervicalgia: Secondary | ICD-10-CM | POA: Diagnosis not present

## 2019-09-22 DIAGNOSIS — R109 Unspecified abdominal pain: Secondary | ICD-10-CM | POA: Diagnosis present

## 2019-09-22 MED ORDER — DEXAMETHASONE 4 MG PO TABS
8.0000 mg | ORAL_TABLET | Freq: Once | ORAL | Status: AC
  Administered 2019-09-22: 8 mg via ORAL
  Filled 2019-09-22: qty 2

## 2019-09-22 MED ORDER — DEXAMETHASONE 4 MG PO TABS
4.0000 mg | ORAL_TABLET | Freq: Two times a day (BID) | ORAL | 0 refills | Status: DC
Start: 2019-09-22 — End: 2020-09-30

## 2019-09-22 MED ORDER — MELOXICAM 7.5 MG PO TABS
7.5000 mg | ORAL_TABLET | Freq: Every day | ORAL | 0 refills | Status: DC | PRN
Start: 2019-09-22 — End: 2020-09-06

## 2019-09-22 MED ORDER — HYDROCODONE-ACETAMINOPHEN 5-325 MG PO TABS
1.0000 | ORAL_TABLET | Freq: Once | ORAL | Status: AC
  Administered 2019-09-22: 1 via ORAL
  Filled 2019-09-22: qty 1

## 2019-09-22 NOTE — ED Triage Notes (Signed)
Pt c/o neck pain that started x 2 months. Pt was sent to therapy and states the pain is worse that it was before she started therapy.

## 2019-09-22 NOTE — ED Provider Notes (Signed)
[facility] Provider Note   CSN: [id number] Arrival date & time: 09/22/19  0016     History Chief Complaint  Patient presents with  . Neck Pain    Nancy Woodward is a 69 y.o. female.  HPI   69 year old female with abdominal pain.  Onset a couple months ago.  She had been receiving physical therapy with improvement.  Pain started worsening again when she woke up a couple days ago.  She is not sure why.  Denies any acute trauma or strain.  No acute numbness, tingling or focal loss of strength.  Past Medical History:  Diagnosis Date  . GERD (gastroesophageal reflux disease)   . Hypercholesteremia   . Hypertension   . Kidney stone     Patient Active Problem List   Diagnosis Date Noted  . Special screening for malignant neoplasms, colon     Past Surgical History:  Procedure Laterality Date  . ABDOMINAL HYSTERECTOMY    . COLONOSCOPY N/A 08/22/2017   Procedure: COLONOSCOPY;  Surgeon: Danie Binder, MD;  [facility];  Service: Endoscopy;  Laterality: N/A;  9:30  . KIDNEY STONE SURGERY    . POLYPECTOMY  08/22/2017   Procedure: POLYPECTOMY;  Surgeon: Danie Binder, MD;  [facility];  Service: Endoscopy;;  decending  . tumor of ovary removed       OB History   No obstetric history on file.     Family History  Problem Relation Age of Onset  . Colon polyps Neg Hx   . Colon cancer Neg Hx     Social History   Tobacco Use  . Smoking status: Current Some Day Smoker    Packs/day: 0.25    Types: Cigarettes  . Smokeless tobacco: Never Used  Vaping Use  . Vaping Use: Never used  Substance Use Topics  . Alcohol use: No  . Drug use: No    Home Medications Prior to Admission medications   Medication Sig Start Date End Date Taking? Authorizing Provider  aspirin EC 81 MG tablet Take 81 mg by mouth daily.    [provider]  aspirin-acetaminophen-caffeine (EXCEDRIN MIGRAINE) [phone] MG tablet Take 2 tablets by  mouth daily as needed for headache.     [provider]  cyclobenzaprine (FLEXERIL) 10 MG tablet Take 1 tablet (10 mg total) by mouth at bedtime. One tablet every night at bedtime as needed for spasm. 09/13/19   Sanjuana Kava, MD  hydrochlorothiazide (HYDRODIURIL) 25 MG tablet Take 25 mg by mouth daily.  02/13/15   [provider]  lisinopril (PRINIVIL,ZESTRIL) 40 MG tablet Take 40 mg by mouth daily.    [provider]  metFORMIN (GLUCOPHAGE) 500 MG tablet Take 500 mg by mouth 2 (two) times daily with a meal.    [provider]  Multiple Vitamin (MULTIVITAMIN WITH MINERALS) TABS tablet Take 1 tablet by mouth daily.    [provider]  ranitidine (ZANTAC) 150 MG tablet Take 150 mg by mouth daily as needed for heartburn.     [provider]  rosuvastatin (CRESTOR) 20 MG tablet Take 1 tablet (20 mg total) by mouth daily. 01/31/14   Fay Records, MD  rosuvastatin (CRESTOR) 40 MG tablet Take 40 mg by mouth daily.    [provider]    Allergies    Augmentin [amoxicillin-pot clavulanate]  Review of Systems   Review of Systems All systems reviewed and negative, other than as noted in HPI.  Physical  Exam Updated Vital Signs BP (!) 158/83 (BP Location: Right Arm)   Pulse 87   Temp 99.3 F (37.4 C) (Oral)   Resp 18   Ht 5\' 6"  (1.676 m)   Wt 77.6 kg   SpO2 95%   BMI 27.60 kg/m   Physical Exam Vitals and nursing note reviewed.  Constitutional:      General: She is not in acute distress.    Appearance: She is well-developed.  HENT:     Head: Normocephalic and atraumatic.  Eyes:     General:        Right eye: No discharge.        Left eye: No discharge.     Conjunctiva/sclera: Conjunctivae normal.  Cardiovascular:     Rate and Rhythm: Normal rate and regular rhythm.     Heart sounds: Normal heart sounds. No murmur heard.  No friction rub. No gallop.   Pulmonary:     Effort: Pulmonary effort is normal. No respiratory  distress.     Breath sounds: Normal breath sounds.  Abdominal:     General: There is no distension.     Palpations: Abdomen is soft.     Tenderness: There is no abdominal tenderness.  Musculoskeletal:        General: Tenderness present.     Cervical back: Neck supple.     Comments: Tenderness to palpation lateral neck bilaterally.  No significant midline tenderness.  Limited range of motion of the neck in all directions secondary to discomfort.  Strength is normal and upper extremities.  Sensation intact to light touch.  Skin:    General: Skin is warm and dry.  Neurological:     Mental Status: She is alert.  Psychiatric:        Behavior: Behavior normal.        Thought Content: Thought content normal.     ED Results / Procedures / Treatments   Labs (all labs ordered are listed, but only abnormal results are displayed) Labs Reviewed - No data to display  EKG None  Radiology No results found.  Procedures Procedures (including critical care time)  Medications Ordered in ED Medications - No data to display  ED Course  I have reviewed the triage vital signs and the nursing notes.  Pertinent labs & imaging results that were available during my care of the patient were reviewed by me and considered in my medical decision making (see chart for details).    MDM Rules/Calculators/A&P                          69 year old female with neck pain without red flags.  Neurovascularly intact.  Plan symptomatic treatment.  Continue outpatient follow-up.  Final Clinical Impression(s) / ED Diagnoses Final diagnoses:  Neck pain    Rx / DC Orders ED Discharge Orders    None       Virgel Manifold, MD 09/26/19 [phone]

## 2019-10-16 ENCOUNTER — Ambulatory Visit: Payer: Medicare HMO | Admitting: Orthopaedic Surgery

## 2019-10-23 ENCOUNTER — Other Ambulatory Visit: Payer: Self-pay

## 2019-10-23 ENCOUNTER — Encounter: Payer: Self-pay | Admitting: Orthopaedic Surgery

## 2019-10-23 ENCOUNTER — Ambulatory Visit: Payer: Medicare HMO | Admitting: Orthopaedic Surgery

## 2019-10-23 VITALS — BP 110/65 | HR 88 | Ht 66.0 in | Wt 171.0 lb

## 2019-10-23 DIAGNOSIS — F17210 Nicotine dependence, cigarettes, uncomplicated: Secondary | ICD-10-CM | POA: Diagnosis not present

## 2019-10-23 DIAGNOSIS — M542 Cervicalgia: Secondary | ICD-10-CM | POA: Diagnosis not present

## 2019-10-23 MED ORDER — HYDROCODONE-ACETAMINOPHEN 5-325 MG PO TABS: ORAL_TABLET | ORAL | 0 refills | Status: DC

## 2019-10-23 NOTE — Progress Notes (Signed)
Patient Nancy Woodward, female DOB:August 18, 1950, 69 y.o. DXI:[id number]  Chief Complaint  Patient presents with  . Neck Pain    HPI  Nancy Woodward is a 69 y.o. female who has neck pain.  She finished PT and was doing well until 09-22-2019 when she had acute neck pain.  She went to the ER.  I have reviewed the notes.  She was given pain medicine which helped.  She is better now but still has some pain.  She is doing the exercises she learned in PT.  She has no weakness.  I have reviewed the ER notes.   Body mass index is 27.6 kg/m.  ROS  Review of Systems  Constitutional: Positive for activity change.  Musculoskeletal: Positive for arthralgias, back pain and myalgias.  All other systems reviewed and are negative.   All other systems reviewed and are negative.  The following is a summary of the past history medically, past history surgically, known current medicines, social history and family history.  This information is gathered electronically by the computer from prior information and documentation.  I review this each visit and have found including this information at this point in the chart is beneficial and informative.    Past Medical History:  Diagnosis Date  . GERD (gastroesophageal reflux disease)   . Hypercholesteremia   . Hypertension   . Kidney stone     Past Surgical History:  Procedure Laterality Date  . ABDOMINAL HYSTERECTOMY    . COLONOSCOPY N/A 08/22/2017   Procedure: COLONOSCOPY;  Surgeon: Danie Binder, MD;  [facility];  Service: Endoscopy;  Laterality: N/A;  9:30  . KIDNEY STONE SURGERY    . POLYPECTOMY  08/22/2017   Procedure: POLYPECTOMY;  Surgeon: Danie Binder, MD;  [facility];  Service: Endoscopy;;  decending  . tumor of ovary removed      Family History  Problem Relation Age of Onset  . Colon polyps Neg Hx   . Colon cancer Neg Hx     Social History Social History   Tobacco Use  . Smoking status: Current  Some Day Smoker    Packs/day: 0.25    Types: Cigarettes  . Smokeless tobacco: Never Used  Vaping Use  . Vaping Use: Never used  Substance Use Topics  . Alcohol use: No  . Drug use: No    Allergies  Allergen Reactions  . Augmentin [Amoxicillin-Pot Clavulanate] Nausea And Vomiting    Has patient had a PCN reaction causing immediate rash, facial/tongue/throat swelling, SOB or lightheadedness with hypotension: No Has patient had a PCN reaction causing severe rash involving mucus membranes or skin necrosis: No Has patient had a PCN reaction that required hospitalization: No Has patient had a PCN reaction occurring within the last 10 years: No If all of the above answers are "NO", then may proceed with Cephalosporin use.     Current Outpatient Medications  Medication Sig Dispense Refill  . aspirin EC 81 MG tablet Take 81 mg by mouth daily.    Marland Kitchen aspirin-acetaminophen-caffeine (EXCEDRIN MIGRAINE) 250-250-65 MG tablet Take 2 tablets by mouth daily as needed for headache.     . cyclobenzaprine (FLEXERIL) 10 MG tablet Take 1 tablet (10 mg total) by mouth at bedtime. One tablet every night at bedtime as needed for spasm. 30 tablet 0  . dexamethasone (DECADRON) 4 MG tablet Take 1 tablet (4 mg total) by mouth 2 (two) times daily. 8 tablet 0  . hydrochlorothiazide (HYDRODIURIL) 25 MG tablet Take  25 mg by mouth daily.   0  . lisinopril (PRINIVIL,ZESTRIL) 40 MG tablet Take 40 mg by mouth daily.    . meloxicam (MOBIC) 7.5 MG tablet Take 1 tablet (7.5 mg total) by mouth daily as needed for pain. 10 tablet 0  . metFORMIN (GLUCOPHAGE) 500 MG tablet Take 500 mg by mouth 2 (two) times daily with a meal.    . Multiple Vitamin (MULTIVITAMIN WITH MINERALS) TABS tablet Take 1 tablet by mouth daily.    . ranitidine (ZANTAC) 150 MG tablet Take 150 mg by mouth daily as needed for heartburn.     . rosuvastatin (CRESTOR) 20 MG tablet Take 1 tablet (20 mg total) by mouth daily. 90 tablet 3  . rosuvastatin  (CRESTOR) 40 MG tablet Take 40 mg by mouth daily.    Marland Kitchen HYDROcodone-acetaminophen (NORCO/VICODIN) 5-325 MG tablet One tablet every four hours as needed for acute pain.  Limit of five days per [place]. 30 tablet 0   No current facility-administered medications for this visit.     Physical Exam  Blood pressure 110/65, pulse 88, height 5\' 6"  (1.676 m), weight 171 lb (77.6 kg).  Constitutional: overall normal hygiene, normal nutrition, well developed, normal grooming, normal body habitus. Assistive device:none  Musculoskeletal: gait and station Limp none, muscle tone and strength are normal, no tremors or atrophy is present.  .  Neurological: coordination overall normal.  Deep tendon reflex/nerve stretch intact.  Sensation normal.  Cranial nerves II-XII intact.   Skin:   Normal overall no scars, lesions, ulcers or rashes. No psoriasis.  Psychiatric: Alert and oriented x 3.  Recent memory intact, remote memory unclear.  Normal mood and affect. Well groomed.  Good eye contact.  Cardiovascular: overall no swelling, no varicosities, no edema bilaterally, normal temperatures of the legs and arms, no clubbing, cyanosis and good capillary refill.  Lymphatic: palpation is normal.  Neck has full ROM today and no weakness.  Grips normal.  All other systems reviewed and are negative   The patient has been educated about the nature of the problem(s) and counseled on treatment options.  The patient appeared to understand what I have discussed and is in agreement with it.  Encounter Diagnoses  Name Primary?  . Cervicalgia Yes  . Nicotine dependence, cigarettes, uncomplicated     PLAN Call if any problems.  Precautions discussed.  Continue current medications.   Return to clinic 1 month   I have reviewed the [place] web site prior to prescribing narcotic medicine for this patient.   Electronically Signed Sanjuana Kava, MD 9/21/20219:29  AM

## 2019-11-20 ENCOUNTER — Ambulatory Visit: Payer: Medicare HMO | Admitting: Orthopaedic Surgery

## 2019-11-27 ENCOUNTER — Ambulatory Visit: Payer: Medicare HMO | Admitting: Orthopaedic Surgery

## 2019-11-27 ENCOUNTER — Encounter: Payer: Self-pay | Admitting: Orthopaedic Surgery

## 2019-11-27 ENCOUNTER — Other Ambulatory Visit: Payer: Self-pay

## 2019-11-27 VITALS — BP 120/72 | HR 90 | Ht 66.0 in | Wt 171.0 lb

## 2019-11-27 DIAGNOSIS — F17210 Nicotine dependence, cigarettes, uncomplicated: Secondary | ICD-10-CM

## 2019-11-27 DIAGNOSIS — M542 Cervicalgia: Secondary | ICD-10-CM | POA: Diagnosis not present

## 2019-11-27 MED ORDER — CYCLOBENZAPRINE HCL 10 MG PO TABS: 10.0000 mg | ORAL_TABLET | Freq: Every day | ORAL | 1 refills | Status: DC

## 2019-11-27 NOTE — Progress Notes (Signed)
Patient Nancy Woodward, female DOB:[birth year], 69 y.o. [id number]  Chief Complaint  Patient presents with  . Neck Pain    neck pain started June, no injury or fall.    HPI  Carmita Boom is a 69 y.o. female who has neck pain. She has good and bad days but overall is better. She has a tightness of the right upper trapezius.  She is doing the exercises she learned in PT.  She needs a refill on her Flexeril.  She has no paresthesias.     Body mass index is 27.6 kg/m.  ROS  Review of Systems  Constitutional: Positive for activity change.  Musculoskeletal: Positive for arthralgias, back pain and myalgias.  All other systems reviewed and are negative.   All other systems reviewed and are negative.  The following is a summary of the past history medically, past history surgically, known current medicines, social history and family history.  This information is gathered electronically by the computer from prior information and documentation.  I review this each visit and have found including this information at this point in the chart is beneficial and informative.    Past Medical History:  Diagnosis Date  . GERD (gastroesophageal reflux disease)   . Hypercholesteremia   . Hypertension   . Kidney stone     Past Surgical History:  Procedure Laterality Date  . ABDOMINAL HYSTERECTOMY    . COLONOSCOPY N/A 08/22/2017   Procedure: COLONOSCOPY;  Surgeon: Danie Binder, MD;  Location: [facility];  Service: Endoscopy;  Laterality: N/A;  9:30  . KIDNEY STONE SURGERY    . POLYPECTOMY  08/22/2017   Procedure: POLYPECTOMY;  Surgeon: Danie Binder, MD;  Location: [facility];  Service: Endoscopy;;  decending  . tumor of ovary removed      Family History  Problem Relation Age of Onset  . Colon polyps Neg Hx   . Colon cancer Neg Hx     Social History Social History   Tobacco Use  . Smoking status: Current Some Day Smoker    Packs/day: 0.25    Types: Cigarettes   . Smokeless tobacco: Never Used  Vaping Use  . Vaping Use: Never used  Substance Use Topics  . Alcohol use: No  . Drug use: No    Allergies  Allergen Reactions  . Augmentin [Amoxicillin-Pot Clavulanate] Nausea And Vomiting    Has patient had a PCN reaction causing immediate rash, facial/tongue/throat swelling, SOB or lightheadedness with hypotension: No Has patient had a PCN reaction causing severe rash involving mucus membranes or skin necrosis: No Has patient had a PCN reaction that required hospitalization: No Has patient had a PCN reaction occurring within the last 10 years: No If all of the above answers are "NO", then may proceed with Cephalosporin use.     Current Outpatient Medications  Medication Sig Dispense Refill  . aspirin EC 81 MG tablet Take 81 mg by mouth daily.    Marland Kitchen aspirin-acetaminophen-caffeine (EXCEDRIN MIGRAINE) 250-250-65 MG tablet Take 2 tablets by mouth daily as needed for headache.     . cyclobenzaprine (FLEXERIL) 10 MG tablet Take 1 tablet (10 mg total) by mouth at bedtime. One tablet every night at bedtime as needed for spasm. 30 tablet 1  . dexamethasone (DECADRON) 4 MG tablet Take 1 tablet (4 mg total) by mouth 2 (two) times daily. 8 tablet 0  . hydrochlorothiazide (HYDRODIURIL) 25 MG tablet Take 25 mg by mouth daily.   0  . HYDROcodone-acetaminophen (NORCO/VICODIN) 5-325  MG tablet One tablet every four hours as needed for acute pain.  Limit of five days per [place]. 30 tablet 0  . lisinopril (PRINIVIL,ZESTRIL) 40 MG tablet Take 40 mg by mouth daily.    . meloxicam (MOBIC) 7.5 MG tablet Take 1 tablet (7.5 mg total) by mouth daily as needed for pain. 10 tablet 0  . metFORMIN (GLUCOPHAGE) 500 MG tablet Take 500 mg by mouth 2 (two) times daily with a meal.    . Multiple Vitamin (MULTIVITAMIN WITH MINERALS) TABS tablet Take 1 tablet by mouth daily.    . ranitidine (ZANTAC) 150 MG tablet Take 150 mg by mouth daily as needed for heartburn.     .  rosuvastatin (CRESTOR) 20 MG tablet Take 1 tablet (20 mg total) by mouth daily. 90 tablet 3  . rosuvastatin (CRESTOR) 40 MG tablet Take 40 mg by mouth daily.     No current facility-administered medications for this visit.     Physical Exam  Blood pressure 120/72, pulse 90, height 5\' 6"  (1.676 m), weight 171 lb (77.6 kg).  Constitutional: overall normal hygiene, normal nutrition, well developed, normal grooming, normal body habitus. Assistive device:none  Musculoskeletal: gait and station Limp none, muscle tone and strength are normal, no tremors or atrophy is present.  .  Neurological: coordination overall normal.  Deep tendon reflex/nerve stretch intact.  Sensation normal.  Cranial nerves II-XII intact.   Skin:   Normal overall no scars, lesions, ulcers or rashes. No psoriasis.  Psychiatric: Alert and oriented x 3.  Recent memory intact, remote memory unclear.  Normal mood and affect. Well groomed.  Good eye contact.  Cardiovascular: overall no swelling, no varicosities, no edema bilaterally, normal temperatures of the legs and arms, no clubbing, cyanosis and good capillary refill.  Lymphatic: palpation is normal.  Neck with full ROM.  All other systems reviewed and are negative   The patient has been educated about the nature of the problem(s) and counseled on treatment options.  The patient appeared to understand what I have discussed and is in agreement with it.  Encounter Diagnoses  Name Primary?  . Cervicalgia Yes  . Nicotine dependence, cigarettes, uncomplicated     PLAN Call if any problems.  Precautions discussed.  Continue current medications.   Return to clinic 6 weeks   Electronically Signed Sanjuana Kava, MD 10/26/20219:09 AM

## 2020-01-08 ENCOUNTER — Ambulatory Visit: Payer: Medicare HMO | Admitting: Orthopaedic Surgery

## 2020-02-07 ENCOUNTER — Ambulatory Visit: Payer: Medicare HMO | Admitting: Orthopaedic Surgery

## 2020-02-19 ENCOUNTER — Ambulatory Visit: Payer: Medicare HMO | Admitting: Orthopaedic Surgery

## 2020-02-26 ENCOUNTER — Ambulatory Visit: Payer: Medicare HMO | Admitting: Orthopaedic Surgery

## 2020-03-04 ENCOUNTER — Other Ambulatory Visit: Payer: Self-pay

## 2020-03-04 ENCOUNTER — Encounter: Payer: Self-pay | Admitting: Orthopaedic Surgery

## 2020-03-04 ENCOUNTER — Ambulatory Visit: Payer: Medicare HMO | Admitting: Orthopaedic Surgery

## 2020-03-04 VITALS — BP 135/81 | HR 100 | Ht 65.0 in | Wt 173.0 lb

## 2020-03-04 DIAGNOSIS — M542 Cervicalgia: Secondary | ICD-10-CM

## 2020-03-04 DIAGNOSIS — F17210 Nicotine dependence, cigarettes, uncomplicated: Secondary | ICD-10-CM | POA: Diagnosis not present

## 2020-03-04 MED ORDER — CYCLOBENZAPRINE HCL 10 MG PO TABS: 10.0000 mg | ORAL_TABLET | Freq: Every day | ORAL | 1 refills | Status: DC

## 2020-03-04 MED ORDER — HYDROCODONE-ACETAMINOPHEN 5-325 MG PO TABS: ORAL_TABLET | ORAL | 0 refills | Status: DC

## 2020-03-04 NOTE — Patient Instructions (Signed)

## 2020-03-04 NOTE — Progress Notes (Signed)
Patient Nancy Woodward, female DOB:[birth year], 70 y.o. [id number]  Chief Complaint  Patient presents with  . Neck Pain    I have good days and bad ones. Need refills on meds    HPI  Nancy Woodward is a 70 y.o. female who has persistent neck pain.  She has no paresthesias.  She has good and bad days.  She has been doing her exercises.  She has no trauma.  She is out of her medicine.   Body mass index is 28.79 kg/m.  ROS  Review of Systems  Constitutional: Positive for activity change.  Musculoskeletal: Positive for arthralgias, back pain and myalgias.  All other systems reviewed and are negative.   All other systems reviewed and are negative.  The following is a summary of the past history medically, past history surgically, known current medicines, social history and family history.  This information is gathered electronically by the computer from prior information and documentation.  I review this each visit and have found including this information at this point in the chart is beneficial and informative.    Past Medical History:  Diagnosis Date  . GERD (gastroesophageal reflux disease)   . Hypercholesteremia   . Hypertension   . Kidney stone     Past Surgical History:  Procedure Laterality Date  . ABDOMINAL HYSTERECTOMY    . COLONOSCOPY N/A 08/22/2017   Procedure: COLONOSCOPY;  Surgeon: Danie Binder, MD;  Location: [facility];  Service: Endoscopy;  Laterality: N/A;  9:30  . KIDNEY STONE SURGERY    . POLYPECTOMY  08/22/2017   Procedure: POLYPECTOMY;  Surgeon: Danie Binder, MD;  Location: [facility];  Service: Endoscopy;;  decending  . tumor of ovary removed      Family History  Problem Relation Age of Onset  . Colon polyps Neg Hx   . Colon cancer Neg Hx     Social History Social History   Tobacco Use  . Smoking status: Current Some Day Smoker    Packs/day: 0.25    Types: Cigarettes  . Smokeless tobacco: Never Used  Vaping Use  .  Vaping Use: Never used  Substance Use Topics  . Alcohol use: No  . Drug use: No    Allergies  Allergen Reactions  . Augmentin [Amoxicillin-Pot Clavulanate] Nausea And Vomiting    Has patient had a PCN reaction causing immediate rash, facial/tongue/throat swelling, SOB or lightheadedness with hypotension: No Has patient had a PCN reaction causing severe rash involving mucus membranes or skin necrosis: No Has patient had a PCN reaction that required hospitalization: No Has patient had a PCN reaction occurring within the last 10 years: No If all of the above answers are "NO", then may proceed with Cephalosporin use.     Current Outpatient Medications  Medication Sig Dispense Refill  . aspirin EC 81 MG tablet Take 81 mg by mouth daily.    Marland Kitchen aspirin-acetaminophen-caffeine (EXCEDRIN MIGRAINE) 250-250-65 MG tablet Take 2 tablets by mouth daily as needed for headache.     . dexamethasone (DECADRON) 4 MG tablet Take 1 tablet (4 mg total) by mouth 2 (two) times daily. 8 tablet 0  . hydrochlorothiazide (HYDRODIURIL) 25 MG tablet Take 25 mg by mouth daily.   0  . lisinopril (PRINIVIL,ZESTRIL) 40 MG tablet Take 40 mg by mouth daily.    . meloxicam (MOBIC) 7.5 MG tablet Take 1 tablet (7.5 mg total) by mouth daily as needed for pain. 10 tablet 0  . metFORMIN (GLUCOPHAGE) 500 MG  tablet Take 500 mg by mouth 2 (two) times daily with a meal.    . Multiple Vitamin (MULTIVITAMIN WITH MINERALS) TABS tablet Take 1 tablet by mouth daily.    . ranitidine (ZANTAC) 150 MG tablet Take 150 mg by mouth daily as needed for heartburn.     . rosuvastatin (CRESTOR) 20 MG tablet Take 1 tablet (20 mg total) by mouth daily. 90 tablet 3  . rosuvastatin (CRESTOR) 40 MG tablet Take 40 mg by mouth daily.    . cyclobenzaprine (FLEXERIL) 10 MG tablet Take 1 tablet (10 mg total) by mouth at bedtime. One tablet every night at bedtime as needed for spasm. 30 tablet 1  . HYDROcodone-acetaminophen (NORCO/VICODIN) 5-325 MG tablet One  tablet every four hours as needed for acute pain.  Limit of five days per [place]. 30 tablet 0   No current facility-administered medications for this visit.     Physical Exam  Blood pressure 135/81, pulse 100, height 5\' 5"  (1.651 m), weight 173 lb (78.5 kg).  Constitutional: overall normal hygiene, normal nutrition, well developed, normal grooming, normal body habitus. Assistive device:none  Musculoskeletal: gait and station Limp none, muscle tone and strength are normal, no tremors or atrophy is present.  .  Neurological: coordination overall normal.  Deep tendon reflex/nerve stretch intact.  Sensation normal.  Cranial nerves II-XII intact.   Skin:   Normal overall no scars, lesions, ulcers or rashes. No psoriasis.  Psychiatric: Alert and oriented x 3.  Recent memory intact, remote memory unclear.  Normal mood and affect. Well groomed.  Good eye contact.  Cardiovascular: overall no swelling, no varicosities, no edema bilaterally, normal temperatures of the legs and arms, no clubbing, cyanosis and good capillary refill.  Lymphatic: palpation is normal.  Neck has good ROM but some pain.  NV intact.  Grips normal.  All other systems reviewed and are negative   The patient has been educated about the nature of the problem(s) and counseled on treatment options.  The patient appeared to understand what I have discussed and is in agreement with it.  Encounter Diagnoses  Name Primary?  . Cervicalgia Yes  . Nicotine dependence, cigarettes, uncomplicated     PLAN Call if any problems.  Precautions discussed.  Continue current medications.   Return to clinic 3 months   I have reviewed the [place] web site prior to prescribing narcotic medicine for this patient.   Electronically Signed Sanjuana Kava, MD 2/1/20228:26 AM

## 2020-06-03 ENCOUNTER — Ambulatory Visit: Payer: Medicare HMO | Admitting: Orthopaedic Surgery

## 2020-06-12 ENCOUNTER — Ambulatory Visit: Payer: Medicare HMO | Admitting: Orthopaedic Surgery

## 2020-06-12 ENCOUNTER — Other Ambulatory Visit: Payer: Self-pay

## 2020-06-12 ENCOUNTER — Encounter: Payer: Self-pay | Admitting: Orthopaedic Surgery

## 2020-06-12 VITALS — BP 116/68 | HR 77 | Ht 65.0 in | Wt 166.0 lb

## 2020-06-12 DIAGNOSIS — F17210 Nicotine dependence, cigarettes, uncomplicated: Secondary | ICD-10-CM | POA: Diagnosis not present

## 2020-06-12 DIAGNOSIS — M542 Cervicalgia: Secondary | ICD-10-CM

## 2020-06-12 MED ORDER — CYCLOBENZAPRINE HCL 10 MG PO TABS: 10.0000 mg | ORAL_TABLET | Freq: Every day | ORAL | 1 refills | Status: DC

## 2020-06-12 MED ORDER — HYDROCODONE-ACETAMINOPHEN 5-325 MG PO TABS: ORAL_TABLET | ORAL | 0 refills | Status: DC

## 2020-06-12 NOTE — Progress Notes (Signed)
Patient Nancy Woodward, female DOB:January 23, 1951, 69 y.o. [id number]:[id number]  Chief Complaint  Patient presents with  . Neck Pain    HPI  Nancy Woodward is a 70 y.o. female who has chronic neck pain with no paresthesias.  She has good and bad days.  The left side is more painful than the right side. She takes her medicine and does her exercises.   Body mass index is 27.62 kg/m.  ROS  Review of Systems  Constitutional: Positive for activity change.  Musculoskeletal: Positive for arthralgias, back pain and myalgias.  All other systems reviewed and are negative.   All other systems reviewed and are negative.  The following is a summary of the past history medically, past history surgically, known current medicines, social history and family history.  This information is gathered electronically by the computer from prior information and documentation.  I review this each visit and have found including this information at this point in the chart is beneficial and informative.    Past Medical History:  Diagnosis Date  . GERD (gastroesophageal reflux disease)   . Hypercholesteremia   . Hypertension   . Kidney stone     Past Surgical History:  Procedure Laterality Date  . ABDOMINAL HYSTERECTOMY    . COLONOSCOPY N/A 08/22/2017   Procedure: COLONOSCOPY;  Surgeon: Danie Binder, MD;  Location: [facility];  Service: Endoscopy;  Laterality: N/A;  9:30  . KIDNEY STONE SURGERY    . POLYPECTOMY  08/22/2017   Procedure: POLYPECTOMY;  Surgeon: Danie Binder, MD;  Location: [facility];  Service: Endoscopy;;  decending  . tumor of ovary removed      Family History  Problem Relation Age of Onset  . Colon polyps Neg Hx   . Colon cancer Neg Hx     Social History Social History   Tobacco Use  . Smoking status: Current Some Day Smoker    Packs/day: 0.25    Types: Cigarettes  . Smokeless tobacco: Never Used  Vaping Use  . Vaping Use: Never used  Substance Use Topics  .  Alcohol use: No  . Drug use: No    Allergies  Allergen Reactions  . Augmentin [Amoxicillin-Pot Clavulanate] Nausea And Vomiting    Has patient had a PCN reaction causing immediate rash, facial/tongue/throat swelling, SOB or lightheadedness with hypotension: No Has patient had a PCN reaction causing severe rash involving mucus membranes or skin necrosis: No Has patient had a PCN reaction that required hospitalization: No Has patient had a PCN reaction occurring within the last 10 years: No If all of the above answers are "NO", then may proceed with Cephalosporin use.     Current Outpatient Medications  Medication Sig Dispense Refill  . aspirin EC 81 MG tablet Take 81 mg by mouth daily.    Marland Kitchen aspirin-acetaminophen-caffeine (EXCEDRIN MIGRAINE) 250-250-65 MG tablet Take 2 tablets by mouth daily as needed for headache.     . cyclobenzaprine (FLEXERIL) 10 MG tablet Take 1 tablet (10 mg total) by mouth at bedtime. One tablet every night at bedtime as needed for spasm. 30 tablet 1  . dexamethasone (DECADRON) 4 MG tablet Take 1 tablet (4 mg total) by mouth 2 (two) times daily. 8 tablet 0  . hydrochlorothiazide (HYDRODIURIL) 25 MG tablet Take 25 mg by mouth daily.   0  . HYDROcodone-acetaminophen (NORCO/VICODIN) 5-325 MG tablet One tablet every four hours as needed for acute pain.  Limit of five days per [place]. 30 tablet 0  .  lisinopril (PRINIVIL,ZESTRIL) 40 MG tablet Take 40 mg by mouth daily.    . meloxicam (MOBIC) 7.5 MG tablet Take 1 tablet (7.5 mg total) by mouth daily as needed for pain. 10 tablet 0  . metFORMIN (GLUCOPHAGE) 500 MG tablet Take 500 mg by mouth 2 (two) times daily with a meal.    . Multiple Vitamin (MULTIVITAMIN WITH MINERALS) TABS tablet Take 1 tablet by mouth daily.    . ranitidine (ZANTAC) 150 MG tablet Take 150 mg by mouth daily as needed for heartburn.     . rosuvastatin (CRESTOR) 20 MG tablet Take 1 tablet (20 mg total) by mouth daily. 90 tablet 3  .  rosuvastatin (CRESTOR) 40 MG tablet Take 40 mg by mouth daily.     No current facility-administered medications for this visit.     Physical Exam  Blood pressure 116/68, pulse 77, height 5\' 5"  (1.651 m), weight 166 lb (75.3 kg).  Constitutional: overall normal hygiene, normal nutrition, well developed, normal grooming, normal body habitus. Assistive device:none  Musculoskeletal: gait and station Limp none, muscle tone and strength are normal, no tremors or atrophy is present.  .  Neurological: coordination overall normal.  Deep tendon reflex/nerve stretch intact.  Sensation normal.  Cranial nerves II-XII intact.   Skin:   Normal overall no scars, lesions, ulcers or rashes. No psoriasis.  Psychiatric: Alert and oriented x 3.  Recent memory intact, remote memory unclear.  Normal mood and affect. Well groomed.  Good eye contact.  Cardiovascular: overall no swelling, no varicosities, no edema bilaterally, normal temperatures of the legs and arms, no clubbing, cyanosis and good capillary refill.  Lymphatic: palpation is normal.  Neck has full ROM.  The left upper trapezius is tight.  NV intact.  Grips normal.  All other systems reviewed and are negative   The patient has been educated about the nature of the problem(s) and counseled on treatment options.  The patient appeared to understand what I have discussed and is in agreement with it.  Encounter Diagnoses  Name Primary?  . Cervicalgia Yes  . Nicotine dependence, cigarettes, uncomplicated     PLAN Call if any problems.  Precautions discussed.  Continue current medications.   Return to clinic 3 months   I have reviewed the [place] web site prior to prescribing narcotic medicine for this patient.   Electronically Signed Sanjuana Kava, MD 5/12/20229:48 AM

## 2020-09-06 ENCOUNTER — Encounter (HOSPITAL_COMMUNITY): Payer: Self-pay | Admitting: Emergency Medicine

## 2020-09-06 ENCOUNTER — Emergency Department (HOSPITAL_COMMUNITY): Payer: Medicare HMO

## 2020-09-06 ENCOUNTER — Other Ambulatory Visit: Payer: Self-pay

## 2020-09-06 ENCOUNTER — Emergency Department (HOSPITAL_COMMUNITY)
Admission: EM | Admit: 2020-09-06 | Discharge: 2020-09-06 | Disposition: A | Discharge status: Home / Self Care | Payer: Medicare HMO | Attending: Emergency Medicine | Admitting: Emergency Medicine

## 2020-09-06 DIAGNOSIS — I10 Essential (primary) hypertension: Secondary | ICD-10-CM | POA: Insufficient documentation

## 2020-09-06 DIAGNOSIS — R109 Unspecified abdominal pain: Secondary | ICD-10-CM | POA: Diagnosis not present

## 2020-09-06 DIAGNOSIS — R7303 Prediabetes: Secondary | ICD-10-CM | POA: Insufficient documentation

## 2020-09-06 DIAGNOSIS — Z79899 Other long term (current) drug therapy: Secondary | ICD-10-CM | POA: Insufficient documentation

## 2020-09-06 DIAGNOSIS — M5441 Lumbago with sciatica, right side: Secondary | ICD-10-CM | POA: Diagnosis not present

## 2020-09-06 DIAGNOSIS — Z7982 Long term (current) use of aspirin: Secondary | ICD-10-CM | POA: Diagnosis not present

## 2020-09-06 DIAGNOSIS — Z7984 Long term (current) use of oral hypoglycemic drugs: Secondary | ICD-10-CM | POA: Diagnosis not present

## 2020-09-06 DIAGNOSIS — F17210 Nicotine dependence, cigarettes, uncomplicated: Secondary | ICD-10-CM | POA: Insufficient documentation

## 2020-09-06 DIAGNOSIS — M545 Low back pain, unspecified: Secondary | ICD-10-CM | POA: Diagnosis present

## 2020-09-06 HISTORY — DX: Prediabetes: R73.03

## 2020-09-06 LAB — URINALYSIS, ROUTINE W REFLEX MICROSCOPIC
Bilirubin Urine: NEGATIVE
Glucose, UA: NEGATIVE mg/dL
Ketones, ur: NEGATIVE mg/dL
Leukocytes,Ua: NEGATIVE
Nitrite: NEGATIVE
Protein, ur: NEGATIVE mg/dL
Specific Gravity, Urine: 1.012 (ref 1.005–1.030)
pH: 5 (ref 5.0–8.0)

## 2020-09-06 LAB — I-STAT CHEM 8, ED
BUN: 21 mg/dL (ref 8–23)
Calcium, Ion: 1.23 mmol/L (ref 1.15–1.40)
Chloride: 103 mmol/L (ref 98–111)
Creatinine, Ser: 1.2 mg/dL — ABNORMAL HIGH (ref 0.44–1.00)
Glucose, Bld: 98 mg/dL (ref 70–99)
HCT: 38 % (ref 36.0–46.0)
Hemoglobin: 12.9 g/dL (ref 12.0–15.0)
Potassium: 3.4 mmol/L — ABNORMAL LOW (ref 3.5–5.1)
Sodium: 142 mmol/L (ref 135–145)
TCO2: 28 mmol/L (ref 22–32)

## 2020-09-06 MED ORDER — METHOCARBAMOL 500 MG PO TABS: 500.0000 mg | ORAL_TABLET | Freq: Three times a day (TID) | ORAL | 0 refills | Status: DC

## 2020-09-06 MED ORDER — HYDROCODONE-ACETAMINOPHEN 5-325 MG PO TABS: ORAL_TABLET | ORAL | 0 refills | Status: DC

## 2020-09-06 NOTE — ED Provider Notes (Addendum)
[facility] EMERGENCY DEPARTMENT Provider Note   CSN: [id number] Arrival date & time: 09/06/20  1006     History Chief Complaint  Patient presents with   Flank Pain    Nancy Woodward is a 70 y.o. female.   Flank Pain Pertinent negatives include no chest pain, no abdominal pain and no shortness of breath.       Nancy Woodward is a 70 y.o. female with past medical history of kidney stones, hypertension, prediabetes and history of atrophy of the right kidney who presents to the Emergency Department complaining of right-sided flank and low back pain.  Symptoms began prior to arrival.  She describes a dull, at times sharp pain in her flank, buttock and right thigh.  Pain worse with movement and improves at rest.  She took 1 muscle relaxer yesterday without relief.  She denies known injury.  She reports history of kidney stones    Past Medical History:  Diagnosis Date   GERD (gastroesophageal reflux disease)    Hypercholesteremia    Hypertension    Kidney stone    Pre-diabetes     Patient Active Problem List   Diagnosis Date Noted   Special screening for malignant neoplasms, colon     Past Surgical History:  Procedure Laterality Date   ABDOMINAL HYSTERECTOMY     COLONOSCOPY N/A 08/22/2017   Procedure: COLONOSCOPY;  Surgeon: Danie Binder, MD;  Location: [facility] ENDO SUITE;  Service: Endoscopy;  Laterality: N/A;  9:30   KIDNEY STONE SURGERY     POLYPECTOMY  08/22/2017   Procedure: POLYPECTOMY;  Surgeon: Danie Binder, MD;  Location: [facility] ENDO SUITE;  Service: Endoscopy;;  decending   tumor of ovary removed       OB History   No obstetric history on file.     Family History  Problem Relation Age of Onset   Colon polyps Neg Hx    Colon cancer Neg Hx     Social History   Tobacco Use   Smoking status: Some Days    Packs/day: 0.25    Types: Cigarettes   Smokeless tobacco: Never  Vaping Use   Vaping Use: Never used  Substance Use Topics   Alcohol use: No    Drug use: No    Home Medications Prior to Admission medications   Medication Sig Start Date End Date Taking? Authorizing Provider  aspirin EC 81 MG tablet Take 81 mg by mouth daily.    [provider]  aspirin-acetaminophen-caffeine (EXCEDRIN MIGRAINE) [phone] MG tablet Take 2 tablets by mouth daily as needed for headache.     [provider]  cyclobenzaprine (FLEXERIL) 10 MG tablet Take 1 tablet (10 mg total) by mouth at bedtime. One tablet every night at bedtime as needed for spasm. 06/12/20   Sanjuana Kava, MD  dexamethasone (DECADRON) 4 MG tablet Take 1 tablet (4 mg total) by mouth 2 (two) times daily. 09/22/19   Virgel Manifold, MD  hydrochlorothiazide (HYDRODIURIL) 25 MG tablet Take 25 mg by mouth daily.  02/13/15   [provider]  HYDROcodone-acetaminophen (NORCO/VICODIN) 5-325 MG tablet One tablet every four hours as needed for acute pain.  Limit of five days per [place]. 06/12/20   Sanjuana Kava, MD  lisinopril (PRINIVIL,ZESTRIL) 40 MG tablet Take 40 mg by mouth daily.    [provider]  meloxicam (MOBIC) 7.5 MG tablet Take 1 tablet (7.5 mg total) by mouth daily as needed for pain. 09/22/19   Virgel Manifold, MD  metFORMIN (  GLUCOPHAGE) 500 MG tablet Take 500 mg by mouth 2 (two) times daily with a meal.    [provider]  Multiple Vitamin (MULTIVITAMIN WITH MINERALS) TABS tablet Take 1 tablet by mouth daily.    [provider]  ranitidine (ZANTAC) 150 MG tablet Take 150 mg by mouth daily as needed for heartburn.     [provider]  rosuvastatin (CRESTOR) 20 MG tablet Take 1 tablet (20 mg total) by mouth daily. 01/31/14   Fay Records, MD  rosuvastatin (CRESTOR) 40 MG tablet Take 40 mg by mouth daily.    [provider]    Allergies    Augmentin [amoxicillin-pot clavulanate]  Review of Systems   Review of Systems  Constitutional:  Negative for chills, fatigue and fever.  Respiratory:  Negative for  cough and shortness of breath.   Cardiovascular:  Negative for chest pain and leg swelling.  Gastrointestinal:  Negative for abdominal pain, blood in stool, nausea and vomiting.  Genitourinary:  Positive for flank pain. Negative for decreased urine volume, difficulty urinating, dysuria, hematuria, pelvic pain, vaginal bleeding and vaginal discharge.  Musculoskeletal:  Positive for back pain. Negative for arthralgias, myalgias, neck pain and neck stiffness.  Skin:  Negative for rash.  Neurological:  Negative for dizziness, weakness and numbness.  Hematological:  Does not bruise/bleed easily.   Physical Exam Updated Vital Signs BP 115/64 (BP Location: Left Arm)   Pulse 80   Temp 98.8 F (37.1 C) (Oral)   Resp 20   Ht '5\' 5"'$  (1.651 m)   Wt 74.4 kg   SpO2 97%   BMI 27.29 kg/m   Physical Exam Vitals and nursing note reviewed.  Constitutional:      Appearance: Normal appearance.  HENT:     Head: Normocephalic.     Mouth/Throat:     Mouth: Mucous membranes are moist.  Cardiovascular:     Rate and Rhythm: Normal rate and regular rhythm.     Pulses: Normal pulses.  Pulmonary:     Effort: Pulmonary effort is normal.     Breath sounds: Normal breath sounds. No wheezing.  Abdominal:     Palpations: Abdomen is soft.     Tenderness: There is no abdominal tenderness. There is no right CVA tenderness, left CVA tenderness, guarding or rebound.  Musculoskeletal:     Lumbar back: Tenderness present. No swelling. Positive right straight leg raise test.     Comments: Tender to palpation of the right lumbar paraspinal muscles and right SI joint space.  No midline tenderness.  Positive straight leg raise on the right at 30 degrees.  Skin:    General: Skin is warm and dry.     Capillary Refill: Capillary refill takes less than 2 seconds.     Findings: No rash.  Neurological:     General: No focal deficit present.     Mental Status: She is alert.     Sensory: No sensory deficit.     Motor:  Motor function is intact. No weakness.     Coordination: Coordination is intact.     Deep Tendon Reflexes:     Reflex Scores:      Patellar reflexes are 2+ on the right side and 2+ on the left side.      Achilles reflexes are 2+ on the right side and 2+ on the left side.   ED Results / Procedures / Treatments   Labs (all labs ordered are listed, but only abnormal results are displayed) Labs  Reviewed  URINALYSIS, ROUTINE W REFLEX MICROSCOPIC - Abnormal; Notable for the following components:      Result Value   Hgb urine dipstick MODERATE (*)    Bacteria, UA RARE (*)    All other components within normal limits  I-STAT CHEM 8, ED - Abnormal; Notable for the following components:   Potassium 3.4 (*)    Creatinine, Ser 1.20 (*)    All other components within normal limits    EKG None  Radiology CT Renal Stone Study  Result Date: 09/06/2020 CLINICAL DATA:  Right-sided flank pain for 1 day. EXAM: CT ABDOMEN AND PELVIS WITHOUT CONTRAST TECHNIQUE: Multidetector CT imaging of the abdomen and pelvis was performed following the standard protocol without IV contrast. COMPARISON:  CT abdomen pelvis dated 06/21/2010 and renal ultrasound dated 11/25/2015. FINDINGS: Lower chest: No acute abnormality. Hepatobiliary: No focal liver abnormality is seen. No gallstones, gallbladder wall thickening, or biliary dilatation. Pancreas: Unremarkable. No pancreatic ductal dilatation or surrounding inflammatory changes. Spleen: Normal in size without focal abnormality. Adrenals/Urinary Tract: Adrenal glands are unremarkable. Right renal cysts measure up to 3.1 cm. Otherwise, the kidneys are normal, without renal calculi, focal lesion, or hydronephrosis. Bladder is unremarkable. Stomach/Bowel: Stomach is within normal limits. Appendix appears normal. There is colonic diverticulosis without evidence of diverticulitis. No evidence of bowel wall thickening, distention, or inflammatory changes. Vascular/Lymphatic: Aortic  atherosclerosis. No enlarged abdominal or pelvic lymph nodes. Reproductive: Status post hysterectomy. No adnexal masses. Other: There is a small fat containing periumbilical hernia. No abdominopelvic ascites. Musculoskeletal: Degenerative changes are seen in the spine. IMPRESSION: No findings to explain the patient's symptoms. Electronically Signed   By: Zerita Boers M.D.   On: 09/06/2020 11:18    Procedures Procedures   Medications Ordered in ED Medications - No data to display  ED Course  I have reviewed the triage vital signs and the nursing notes.  Pertinent labs & imaging results that were available during my care of the patient were reviewed by me and considered in my medical decision making (see chart for details).    MDM Rules/Calculators/A&P                           Patient here for evaluation of right-sided low back pain.  Symptoms began yesterday.  No known injury.  Neurovascularly intact.  She does have tenderness to palpation over the right lumbar paraspinal muscles.  Symptoms felt to be musculoskeletal.  On review of patient's medical record, she has noted to have atrophy of the right kidney and history of kidney stones.  Clinically my suspicion for nephrolithiasis is low, but given patient's history will obtain urinalysis and CT renal stone study.  Labs interpreted by me, urinalysis shows moderate hemoglobin without evidence of infection.  Chemistries show serum creatinine of 1.2 improved from 1.68 three years ago.  Patient is ambulatory steady gait.  No focal neurodeficits.  No red flags that are concerning for emergent process.  Feel her symptoms are musculoskeletal.  Discussed findings with patient.  She is agreeable to follow-up with PCP and/or her orthopedist.  Return precautions discussed.  Final Clinical Impression(s) / ED Diagnoses Final diagnoses:  Acute right-sided low back pain with right-sided sciatica    Rx / DC Orders ED Discharge Orders     None            Bufford Lope 09/06/20 1215    Noemi Chapel, MD 09/07/20 1136

## 2020-09-06 NOTE — Discharge Instructions (Addendum)
You may alternate ice and heat to your lower back.  Take medication as directed.  Muscle relaxer may cause drowsiness.  Do not operate machinery or drive while taking the medication.  Follow-up with your primary care provider for recheck if not improving in 1 week.

## 2020-09-06 NOTE — ED Triage Notes (Signed)
Pt c/o right sided flank pain x 1 day that starts in lower right back and radiates around right flank; denies urinary symptoms and fevers; reports hx of kidney stones but states this does not feel the same

## 2020-09-16 ENCOUNTER — Ambulatory Visit: Payer: Medicare HMO | Admitting: Orthopaedic Surgery

## 2020-09-30 ENCOUNTER — Ambulatory Visit: Payer: Medicare HMO | Admitting: Orthopaedic Surgery

## 2020-09-30 ENCOUNTER — Other Ambulatory Visit: Payer: Self-pay

## 2020-09-30 ENCOUNTER — Encounter: Payer: Self-pay | Admitting: Orthopaedic Surgery

## 2020-09-30 VITALS — BP 123/64 | HR 66 | Ht 66.0 in | Wt 164.0 lb

## 2020-09-30 DIAGNOSIS — M5430 Sciatica, unspecified side: Secondary | ICD-10-CM | POA: Insufficient documentation

## 2020-09-30 DIAGNOSIS — E782 Mixed hyperlipidemia: Secondary | ICD-10-CM | POA: Insufficient documentation

## 2020-09-30 DIAGNOSIS — F17210 Nicotine dependence, cigarettes, uncomplicated: Secondary | ICD-10-CM | POA: Diagnosis not present

## 2020-09-30 DIAGNOSIS — K219 Gastro-esophageal reflux disease without esophagitis: Secondary | ICD-10-CM | POA: Insufficient documentation

## 2020-09-30 DIAGNOSIS — R7989 Other specified abnormal findings of blood chemistry: Secondary | ICD-10-CM | POA: Insufficient documentation

## 2020-09-30 DIAGNOSIS — R7303 Prediabetes: Secondary | ICD-10-CM | POA: Insufficient documentation

## 2020-09-30 DIAGNOSIS — M542 Cervicalgia: Secondary | ICD-10-CM | POA: Diagnosis not present

## 2020-09-30 DIAGNOSIS — I10 Essential (primary) hypertension: Secondary | ICD-10-CM | POA: Insufficient documentation

## 2020-09-30 DIAGNOSIS — Z79899 Other long term (current) drug therapy: Secondary | ICD-10-CM | POA: Insufficient documentation

## 2020-09-30 DIAGNOSIS — M79671 Pain in right foot: Secondary | ICD-10-CM | POA: Insufficient documentation

## 2020-09-30 DIAGNOSIS — F419 Anxiety disorder, unspecified: Secondary | ICD-10-CM | POA: Insufficient documentation

## 2020-09-30 DIAGNOSIS — Z720 Tobacco use: Secondary | ICD-10-CM | POA: Insufficient documentation

## 2020-09-30 MED ORDER — METHOCARBAMOL 500 MG PO TABS: 500.0000 mg | ORAL_TABLET | Freq: Three times a day (TID) | ORAL | 1 refills | Status: AC

## 2020-09-30 MED ORDER — HYDROCODONE-ACETAMINOPHEN 5-325 MG PO TABS: ORAL_TABLET | ORAL | 0 refills | Status: DC

## 2020-09-30 NOTE — Patient Instructions (Signed)
Steps to Quit Smoking Smoking tobacco is the leading cause of preventable death. It can affect almost every organ in the body. Smoking puts you and people around you at risk for many serious, long-lasting (chronic) diseases. Quitting smoking can be hard, but it is one of the best things that you can do for your health. It is never too late to quit. How do I get ready to quit? When you decide to quit smoking, make a plan to help you succeed. Before you quit: Pick a date to quit. Set a date within the next 2 weeks to give you time to prepare. Write down the reasons why you are quitting. Keep this list in places where you will see it often. Tell your family, friends, and co-workers that you are quitting. Their support is important. Talk with your doctor about the choices that may help you quit. Find out if your health insurance will pay for these treatments. Know the people, places, things, and activities that make you want to smoke (triggers). Avoid them. What first steps can I take to quit smoking? Throw away all cigarettes at home, at work, and in your car. Throw away the things that you use when you smoke, such as ashtrays and lighters. Clean your car. Make sure to empty the ashtray. Clean your home, including curtains and carpets. What can I do to help me quit smoking? Talk with your doctor about taking medicines and seeing a counselor at the same time. You are more likely to succeed when you do both. If you are pregnant or breastfeeding, talk with your doctor about counseling or other ways to quit smoking. Do not take medicine to help you quit smoking unless your doctor tells you to do so. To quit smoking: Quit right away Quit smoking totally, instead of slowly cutting back on how much you smoke over a period of time. Go to counseling. You are more likely to quit if you go to counseling sessions regularly. Take medicine You may take medicines to help you quit. Some medicines need a  prescription, and some you can buy over-the-counter. Some medicines may contain a drug called nicotine to replace the nicotine in cigarettes. Medicines may: Help you to stop having the desire to smoke (cravings). Help to stop the problems that come when you stop smoking (withdrawal symptoms). Your doctor may ask you to use: Nicotine patches, gum, or lozenges. Nicotine inhalers or sprays. Non-nicotine medicine that is taken by mouth. Find resources Find resources and other ways to help you quit smoking and remain smoke-free after you quit. These resources are most helpful when you use them often. They include: Online chats with a counselor. Phone quitlines. Printed self-help materials. Support groups or group counseling. Text messaging programs. Mobile phone apps. Use apps on your mobile phone or tablet that can help you stick to your quit plan. There are many free apps for mobile phones and tablets as well as websites. Examples include Quit Guide from the CDC and smokefree.gov  What things can I do to make it easier to quit?  Talk to your family and friends. Ask them to support and encourage you. Call a phone quitline (1-800-QUIT-NOW), reach out to support groups, or work with a counselor. Ask people who smoke to not smoke around you. Avoid places that make you want to smoke, such as: Bars. Parties. Smoke-break areas at work. Spend time with people who do not smoke. Lower the stress in your life. Stress can make you want to   smoke. Try these things to help your stress: Getting regular exercise. Doing deep-breathing exercises. Doing yoga. Meditating. Doing a body scan. To do this, close your eyes, focus on one area of your body at a time from head to toe. Notice which parts of your body are tense. Try to relax the muscles in those areas. How will I feel when I quit smoking? Day 1 to 3 weeks Within the first 24 hours, you may start to have some problems that come from quitting tobacco.  These problems are very bad 2-3 days after you quit, but they do not often last for more than 2-3 weeks. You may get these symptoms: Mood swings. Feeling restless, nervous, angry, or annoyed. Trouble concentrating. Dizziness. Strong desire for high-sugar foods and nicotine. Weight gain. Trouble pooping (constipation). Feeling like you may vomit (nausea). Coughing or a sore throat. Changes in how the medicines that you take for other issues work in your body. Depression. Trouble sleeping (insomnia). Week 3 and afterward After the first 2-3 weeks of quitting, you may start to notice more positive results, such as: Better sense of smell and taste. Less coughing and sore throat. Slower heart rate. Lower blood pressure. Clearer skin. Better breathing. Fewer sick days. Quitting smoking can be hard. Do not give up if you fail the first time. Some people need to try a few times before they succeed. Do your best to stick to your quit plan, and talk with your doctor if you have any questions or concerns. Summary Smoking tobacco is the leading cause of preventable death. Quitting smoking can be hard, but it is one of the best things that you can do for your health. When you decide to quit smoking, make a plan to help you succeed. Quit smoking right away, not slowly over a period of time. When you start quitting, seek help from your doctor, family, or friends. This information is not intended to replace advice given to you by your health care provider. Make sure you discuss any questions you have with your health care provider. Document Revised: 10/13/2018 Document Reviewed: 04/08/2018 Elsevier Patient Education  2022 [employer]vier Inc.  

## 2020-09-30 NOTE — Progress Notes (Signed)
My neck hurts  She has chronic neck pain.  She has good and bad days.  She has no numbness, no new trauma.  She is active and taking her medicine.   ROM of the neck is full.  NV intact.  Grips are good.  Encounter Diagnoses  Name Primary?   Cervicalgia Yes   Nicotine dependence, cigarettes, uncomplicated    I will refill her pain medicine and Robaxin.  I have reviewed the [place] web site prior to prescribing narcotic medicine for this patient.  Return in three months.  Call if any problem.  Precautions discussed.  Electronically Signed Sanjuana Kava, MD 8/30/202210:04 AM

## 2020-12-14 ENCOUNTER — Other Ambulatory Visit: Payer: Self-pay

## 2020-12-14 ENCOUNTER — Emergency Department (HOSPITAL_COMMUNITY)
Admission: EM | Admit: 2020-12-14 | Discharge: 2020-12-15 | Disposition: A | Discharge status: Home / Self Care | Payer: Medicare HMO | Attending: Emergency Medicine | Admitting: Emergency Medicine

## 2020-12-14 ENCOUNTER — Encounter (HOSPITAL_COMMUNITY): Payer: Self-pay

## 2020-12-14 DIAGNOSIS — L500 Allergic urticaria: Secondary | ICD-10-CM | POA: Diagnosis not present

## 2020-12-14 DIAGNOSIS — T7840XA Allergy, unspecified, initial encounter: Secondary | ICD-10-CM

## 2020-12-14 DIAGNOSIS — F17210 Nicotine dependence, cigarettes, uncomplicated: Secondary | ICD-10-CM | POA: Insufficient documentation

## 2020-12-14 DIAGNOSIS — I10 Essential (primary) hypertension: Secondary | ICD-10-CM | POA: Insufficient documentation

## 2020-12-14 MED ORDER — FAMOTIDINE 20 MG PO TABS: 20.0000 mg | ORAL_TABLET | Freq: Two times a day (BID) | ORAL | 0 refills | Status: AC

## 2020-12-14 MED ORDER — DEXAMETHASONE SODIUM PHOSPHATE 10 MG/ML IJ SOLN
10.0000 mg | Freq: Once | INTRAMUSCULAR | Status: AC
  Administered 2020-12-14: 10 mg via INTRAVENOUS
  Filled 2020-12-14: qty 1

## 2020-12-14 MED ORDER — FAMOTIDINE IN NACL 20-0.9 MG/50ML-% IV SOLN
20.0000 mg | Freq: Once | INTRAVENOUS | Status: AC
  Administered 2020-12-14: 20 mg via INTRAVENOUS
  Filled 2020-12-14: qty 50

## 2020-12-14 MED ORDER — PREDNISONE 20 MG PO TABS: 40.0000 mg | ORAL_TABLET | Freq: Every day | ORAL | 0 refills | Status: DC

## 2020-12-14 MED ORDER — DIPHENHYDRAMINE HCL 50 MG/ML IJ SOLN
25.0000 mg | Freq: Once | INTRAMUSCULAR | Status: AC
  Administered 2020-12-14: 25 mg via INTRAVENOUS
  Filled 2020-12-14: qty 1

## 2020-12-14 NOTE — Discharge Instructions (Signed)
You have been diagnosed with an allergic reaction. Usually allergic reactions like this are caused by exposures to something that you either ate or touched or smelled.  It may be related to a number of different exposures including a new perfume, topical creams, soaps, detergents, linens, clothing, medications. Occasionally we do not find an answer for why there is an allergic reaction. These are treated the same way including Benadryl as needed for itching and rash. (This can be used up to 50 mg every 6 hours as needed).  Pepcid 20 mg every night and prednisone once a day for 5 days. Please do not take the Benadryl and drive or take care of children or other imported duties as the Benadryl can make you sleepy.    If you should develop severe or worsening symptoms including difficulty breathing, difficulty swallowing, wheezing or increased coughing or a rash that developed on the inside of your mouth or a worsening rash on your skin, return to the hospital immediately for a recheck. Please call your Dr. in the morning for a recheck in 2 days if you are still having symptoms. If you do not have a Dr. see the list below.  If we have identified the source of your allergic reaction, please avoid this at all costs. This means stopping the medication if it is a new medication or a voiding topical exposures such as creams lotions body soaps or deodorants if this is the source.  Allergic Reaction, Mild to Moderate Allergies may happen from anything your body is sensitive to. This may be food, medications, pollens, chemicals, and nearly anything around you in everyday life that produces allergens. An allergen is anything that causes an allergy producing substance. Allergens cause your body to release allergic antibodies. Through a chain of events, they cause a release of histamine into the blood stream. Histamines are meant to protect you, but they also cause your discomfort. This is why antihistamines are often used  for allergies. Heredity is often a factor in causing allergic reactions. This means you may have some of the same allergies as your parents. Allergies happen in all age groups. You may have some idea of what caused your reaction. There are many allergens around [place]. It may be difficult to know what caused your reaction. If this is a first time event, it may never happen again. Allergies cannot be cured but can be controlled with medications. SYMPTOMS  You may get some or all of the following problems from allergies. Swelling and itching in and around the mouth.  Tearing, itchy eyes.  Nasal congestion and runny nose.  Sneezing and coughing.  An itchy red rash or hives.  Vomiting or diarrhea.  Difficulty breathing.  Seasonal allergies occur in all age groups. They are seasonal because they usually occur during the same season every year. They may be a reaction to molds, grass pollens, or tree pollens. Other causes of allergies are house dust mite allergens, pet dander and mold spores. These are just a common few of the thousands of allergens around [place]. All of the symptoms listed above happen when you come in contact with pollens and other allergens. Seasonal allergies are usually not life threatening. They are generally more of a nuisance that can often be handled using medications. Hay fever is a combination of all or some of the above listed allergy problems. It may often be treated with simple over-the-counter medications such as diphenhydramine. Take medication as directed. Check with your caregiver or package  insert for child dosages. TREATMENT AND HOME CARE INSTRUCTIONS If hives or rash are present: Take medications as directed.  You may use an over-the-counter antihistamine (diphenhydramine) for hives and itching as needed. Do not drive or drink alcohol until medications used to treat the reaction have worn off. Antihistamines tend to make people sleepy.  Apply cold cloths (compresses) to the  skin or take baths in cool water. This will help itching. Avoid hot baths or showers. Heat will make a rash and itching worse.  If your allergies persist and become more severe, and over the counter medications are not effective, there are many new medications your caretaker can prescribe. Immunotherapy or desensitizing injections can be used if all else fails. Follow up with your caregiver if problems continue.  SEEK MEDICAL CARE IF:  Your allergies are becoming progressively more troublesome.  You suspect a food allergy. Symptoms generally happen within 30 minutes of eating a food.  Your symptoms have not gone away within 2 days or are getting worse.  You develop new symptoms.  You want to retest yourself or your child with a food or drink you think causes an allergic reaction. Never test yourself or your child of a suspected allergy without being under the watchful eye of your caregivers. A second exposure to an allergen may be life-threatening.  SEEK IMMEDIATE MEDICAL CARE IF: You develop difficulty breathing or wheezing, or have a tight feeling in your chest or throat.  You develop a swollen mouth, hives, swelling, or itching all over your body.  A severe reaction with any of the above problems should be considered life-threatening. If you suddenly develop difficulty breathing call for local emergency medical help. THIS IS AN EMERGENCY. MAKE SURE YOU:  Understand these instructions.  Will watch your condition.  Will get help right away if you are not doing well or get worse.  Document Released: 11/15/2006 Document Revised: 01/07/2011 Document Reviewed: 11/15/2006 [facility] Patient Information 2012 [place].

## 2020-12-14 NOTE — ED Triage Notes (Signed)
Pt denies SOB or difficulty breathing.

## 2020-12-14 NOTE — ED Triage Notes (Signed)
Pt arrived via POV c/o allergic reaction that began apprx at 1900 tonight. Pt has difficult time sitting still and is persistently itching whole body during Triage. Pt has hives spreading on all extremities, back and abdomen.

## 2020-12-14 NOTE — ED Provider Notes (Signed)
[facility] EMERGENCY DEPARTMENT Provider Note   CSN: [id number] Arrival date & time: 12/14/20  2114     History Chief Complaint  Patient presents with   Allergic Reaction    Nancy Woodward is a 70 y.o. female.   Allergic Reaction  This patient is a 70 year old female, history of hypercholesterolemia hypertension and prediabetes, she has no prior history of allergic reactions of any kind, states that this evening approximately 2-1/2 hours ago she started to develop itching and hives across her arms around her trunk at the waist and even down onto her thighs.  She denies any new exposures either by food medication or topically.  She does not have any difficulty breathing, she has not taken any medication, she is itching and possibly at these areas.  Symptoms are persistent and severe.  Past Medical History:  Diagnosis Date   GERD (gastroesophageal reflux disease)    Hypercholesteremia    Hypertension    Kidney stone    Pre-diabetes     Patient Active Problem List   Diagnosis Date Noted   Anxiety 09/30/2020   Benign essential hypertension 09/30/2020   Gastroesophageal reflux disease 09/30/2020   Other long term (current) drug therapy 09/30/2020   Mixed hyperlipidemia 09/30/2020   Other specified abnormal findings of blood chemistry 09/30/2020   Pain in right foot 09/30/2020   Prediabetes 09/30/2020   Sciatica 09/30/2020   Tobacco abuse 09/30/2020   Special screening for malignant neoplasms, colon     Past Surgical History:  Procedure Laterality Date   ABDOMINAL HYSTERECTOMY     COLONOSCOPY N/A 08/22/2017   Procedure: COLONOSCOPY;  Surgeon: Danie Binder, MD;  Location: [facility];  Service: Endoscopy;  Laterality: N/A;  9:30   KIDNEY STONE SURGERY     POLYPECTOMY  08/22/2017   Procedure: POLYPECTOMY;  Surgeon: Danie Binder, MD;  Location: [facility];  Service: Endoscopy;;  decending   tumor of ovary removed       OB History   No obstetric history on  file.     Family History  Problem Relation Age of Onset   Colon polyps Neg Hx    Colon cancer Neg Hx     Social History   Tobacco Use   Smoking status: Some Days    Packs/day: 0.25    Types: Cigarettes   Smokeless tobacco: Never  Vaping Use   Vaping Use: Never used  Substance Use Topics   Alcohol use: No   Drug use: No    Home Medications Prior to Admission medications   Medication Sig Start Date End Date Taking? Authorizing Provider  famotidine (PEPCID) 20 MG tablet Take 1 tablet (20 mg total) by mouth 2 (two) times daily. 12/14/20  Yes Noemi Chapel, MD  predniSONE (DELTASONE) 20 MG tablet Take 2 tablets (40 mg total) by mouth daily. 12/14/20  Yes Noemi Chapel, MD  aspirin EC 81 MG tablet Take 81 mg by mouth daily.    [provider]  aspirin-acetaminophen-caffeine (EXCEDRIN MIGRAINE) [phone] MG tablet Take 2 tablets by mouth daily as needed for headache.     [provider]  hydrochlorothiazide (HYDRODIURIL) 25 MG tablet Take 25 mg by mouth daily.  02/13/15   [provider]  HYDROcodone-acetaminophen (NORCO/VICODIN) 5-325 MG tablet One tablet every six hours for pain.  Limit 7 days. 09/30/20   Sanjuana Kava, MD  lisinopril (PRINIVIL,ZESTRIL) 40 MG tablet Take 40 mg by mouth daily.    [provider]  metFORMIN (GLUCOPHAGE) 500  MG tablet Take 500 mg by mouth 2 (two) times daily with a meal.    [provider]  methocarbamol (ROBAXIN) 500 MG tablet Take 1 tablet (500 mg total) by mouth 3 (three) times daily. 09/30/20   Sanjuana Kava, MD  Multiple Vitamin (MULTIVITAMIN WITH MINERALS) TABS tablet Take 1 tablet by mouth daily.    [provider]  ranitidine (ZANTAC) 150 MG tablet Take 150 mg by mouth daily as needed for heartburn.     [provider]  rosuvastatin (CRESTOR) 20 MG tablet Take 1 tablet (20 mg total) by mouth daily. 01/31/14   Fay Records, MD  rosuvastatin (CRESTOR) 40 MG tablet Take 40 mg by mouth  daily.    [provider]    Allergies    Augmentin [amoxicillin-pot clavulanate]  Review of Systems   Review of Systems  All other systems reviewed and are negative.  Physical Exam Updated Vital Signs BP 107/65 (BP Location: Right Arm)   Pulse 99   Temp 97.9 F (36.6 C) (Oral)   Resp 16   Ht 1.676 m (5\' 6" )   Wt 75 kg   SpO2 97%   BMI 26.69 kg/m   Physical Exam Vitals and nursing note reviewed.  Constitutional:      General: She is not in acute distress.    Appearance: She is well-developed.  HENT:     Head: Normocephalic and atraumatic.     Mouth/Throat:     Pharynx: No oropharyngeal exudate.  Eyes:     General: No scleral icterus.       Right eye: No discharge.        Left eye: No discharge.     Conjunctiva/sclera: Conjunctivae normal.     Pupils: Pupils are equal, round, and reactive to light.  Neck:     Thyroid: No thyromegaly.     Vascular: No JVD.  Cardiovascular:     Rate and Rhythm: Normal rate and regular rhythm.     Heart sounds: Normal heart sounds. No murmur heard.   No friction rub. No gallop.  Pulmonary:     Effort: Pulmonary effort is normal. No respiratory distress.     Breath sounds: Normal breath sounds. No wheezing or rales.  Abdominal:     General: Bowel sounds are normal. There is no distension.     Palpations: Abdomen is soft. There is no mass.     Tenderness: There is no abdominal tenderness.  Musculoskeletal:        General: No tenderness. Normal range of motion.     Cervical back: Normal range of motion and neck supple.  Lymphadenopathy:     Cervical: No cervical adenopathy.  Skin:    General: Skin is warm and dry.     Findings: Rash present. No erythema.     Comments: Urticarial lesions concentrated around the lower trunk, the upper thighs, bilateral arms  Neurological:     Mental Status: She is alert.     Coordination: Coordination normal.  Psychiatric:        Behavior: Behavior normal.    ED Results / Procedures  / Treatments   Labs (all labs ordered are listed, but only abnormal results are displayed) Labs Reviewed - No data to display  EKG None  Radiology No results found.  Procedures Procedures   Medications Ordered in ED Medications  dexamethasone (DECADRON) injection 10 mg (10 mg Intravenous Given 12/14/20 2218)  diphenhydrAMINE (BENADRYL) injection 25 mg (25 mg Intravenous Given 12/14/20 2221)  famotidine (PEPCID) IVPB 20 mg premix (20 mg Intravenous New Bag/Given 12/14/20 2228)    ED Course  I have reviewed the triage vital signs and the nursing notes.  Pertinent labs & imaging results that were available during my care of the patient were reviewed by me and considered in my medical decision making (see chart for details).    MDM Rules/Calculators/A&P                           There is no airway issues, no wheezing, no respiratory symptoms at all, no nausea or vomiting, no abnormal tachycardia or hypotension to be concerned about anaphylaxis.  Will give steroids, Benadryl, Pepcid and observed for short while.  Patient agreeable, no obvious source of the patient's symptoms is uncovered through history  After getting medications the patient improved significantly, she is well-appearing at discharge with only minimal hives left, no airway issues, vital signs remained stable.  She understands the need to follow-up for allergy testing, the significant other in the room is asked questions they both understand the instructions and are in agreement for discharge  Final Clinical Impression(s) / ED Diagnoses Final diagnoses:  Allergic reaction, initial encounter    Rx / DC Orders ED Discharge Orders          Ordered    predniSONE (DELTASONE) 20 MG tablet  Daily        12/14/20 2317    famotidine (PEPCID) 20 MG tablet  2 times daily        12/14/20 2317             Noemi Chapel, MD 12/14/20 [phone]

## 2021-01-01 ENCOUNTER — Ambulatory Visit: Payer: Medicare HMO | Admitting: Orthopaedic Surgery

## 2021-05-12 DIAGNOSIS — M542 Cervicalgia: Secondary | ICD-10-CM | POA: Insufficient documentation

## 2021-05-13 ENCOUNTER — Other Ambulatory Visit: Payer: Self-pay | Admitting: Nephrology

## 2021-05-13 ENCOUNTER — Other Ambulatory Visit (HOSPITAL_COMMUNITY): Payer: Self-pay | Admitting: Nephrology

## 2021-05-13 DIAGNOSIS — N200 Calculus of kidney: Secondary | ICD-10-CM

## 2021-05-13 DIAGNOSIS — E790 Hyperuricemia without signs of inflammatory arthritis and tophaceous disease: Secondary | ICD-10-CM

## 2021-05-13 DIAGNOSIS — N1832 Chronic kidney disease, stage 3b: Secondary | ICD-10-CM

## 2021-05-21 ENCOUNTER — Ambulatory Visit (HOSPITAL_COMMUNITY)
Admission: RE | Admit: 2021-05-21 | Discharge: 2021-05-21 | Disposition: A | Discharge status: Home / Self Care | Payer: Medicare HMO | Source: Ambulatory Visit | Attending: Nephrology | Admitting: Nephrology

## 2021-05-21 DIAGNOSIS — E790 Hyperuricemia without signs of inflammatory arthritis and tophaceous disease: Secondary | ICD-10-CM | POA: Diagnosis present

## 2021-05-21 DIAGNOSIS — N1832 Chronic kidney disease, stage 3b: Secondary | ICD-10-CM | POA: Diagnosis present

## 2021-05-21 DIAGNOSIS — N200 Calculus of kidney: Secondary | ICD-10-CM | POA: Diagnosis present

## 2021-07-20 ENCOUNTER — Other Ambulatory Visit (HOSPITAL_COMMUNITY): Payer: Self-pay | Admitting: Emergency Medicine

## 2021-07-20 DIAGNOSIS — Z780 Asymptomatic menopausal state: Secondary | ICD-10-CM

## 2021-07-20 DIAGNOSIS — Z1231 Encounter for screening mammogram for malignant neoplasm of breast: Secondary | ICD-10-CM

## 2021-07-27 ENCOUNTER — Ambulatory Visit (HOSPITAL_COMMUNITY)
Admission: RE | Admit: 2021-07-27 | Discharge: 2021-07-27 | Disposition: A | Discharge status: Home / Self Care | Payer: Medicare HMO | Source: Ambulatory Visit | Attending: Emergency Medicine | Admitting: Emergency Medicine

## 2021-07-27 DIAGNOSIS — Z1231 Encounter for screening mammogram for malignant neoplasm of breast: Secondary | ICD-10-CM | POA: Insufficient documentation

## 2021-07-27 DIAGNOSIS — Z780 Asymptomatic menopausal state: Secondary | ICD-10-CM

## 2021-07-28 ENCOUNTER — Inpatient Hospital Stay
Admission: RE | Admit: 2021-07-28 | Discharge: 2021-07-28 | Disposition: A | Discharge status: Home / Self Care | Payer: Self-pay | Source: Ambulatory Visit | Attending: Emergency Medicine | Admitting: Emergency Medicine

## 2021-07-28 ENCOUNTER — Other Ambulatory Visit (HOSPITAL_COMMUNITY): Payer: Self-pay | Admitting: Emergency Medicine

## 2021-07-28 DIAGNOSIS — Z1231 Encounter for screening mammogram for malignant neoplasm of breast: Secondary | ICD-10-CM

## 2022-02-26 ENCOUNTER — Emergency Department (HOSPITAL_COMMUNITY): Payer: Medicare HMO

## 2022-02-26 ENCOUNTER — Emergency Department (HOSPITAL_COMMUNITY)
Admission: EM | Admit: 2022-02-26 | Discharge: 2022-02-26 | Disposition: A | Discharge status: Home / Self Care | Payer: Medicare HMO | Attending: Emergency Medicine | Admitting: Emergency Medicine

## 2022-02-26 DIAGNOSIS — M25521 Pain in right elbow: Secondary | ICD-10-CM | POA: Insufficient documentation

## 2022-02-26 DIAGNOSIS — Z7982 Long term (current) use of aspirin: Secondary | ICD-10-CM | POA: Diagnosis not present

## 2022-02-26 MED ORDER — IBUPROFEN 800 MG PO TABS: 800.0000 mg | ORAL_TABLET | Freq: Three times a day (TID) | ORAL | 0 refills | Status: AC | PRN

## 2022-02-26 MED ORDER — HYDROCODONE-ACETAMINOPHEN 5-325 MG PO TABS: ORAL_TABLET | ORAL | 0 refills | Status: AC

## 2022-02-26 MED ORDER — HYDROCODONE-ACETAMINOPHEN 5-325 MG PO TABS
2.0000 | ORAL_TABLET | Freq: Once | ORAL | Status: AC
  Administered 2022-02-26: 2 via ORAL
  Filled 2022-02-26: qty 2

## 2022-02-26 NOTE — Discharge Instructions (Addendum)
Schedule to see the Orthopaedist for evaluation  

## 2022-02-26 NOTE — ED Provider Notes (Signed)
[facility] Provider Note   CSN: [id number] Arrival date & time: 02/26/22  1419     History  Chief Complaint  Patient presents with   Arm Injury    right    Nancy Woodward is a 72 y.o. female.  Pt reports she awoke this am with pain in her right elbow.  Pt denies any injury.  Pt denies any previous problems. With her elbow.    The history is provided by the patient. No language interpreter was used.  Arm Injury Location:  Elbow Elbow location:  R elbow Injury: no   Pain details:    Quality:  Aching   Radiates to:  Does not radiate   Severity:  Moderate   Onset quality:  Gradual   Duration:  1 day   Timing:  Constant Prior injury to area:  No Relieved by:  Nothing Worsened by:  Nothing Ineffective treatments:  None tried Risk factors: no concern for non-accidental trauma        Home Medications Prior to Admission medications   Medication Sig Start Date End Date Taking? Authorizing Provider  aspirin EC 81 MG tablet Take 81 mg by mouth daily.    [provider]  aspirin-acetaminophen-caffeine (EXCEDRIN MIGRAINE) [phone] MG tablet Take 2 tablets by mouth daily as needed for headache.     [provider]  famotidine (PEPCID) 20 MG tablet Take 1 tablet (20 mg total) by mouth 2 (two) times daily. 12/14/20   Nancy Chapel, MD  hydrochlorothiazide (HYDRODIURIL) 25 MG tablet Take 25 mg by mouth daily.  02/13/15   [provider]  HYDROcodone-acetaminophen (NORCO/VICODIN) 5-325 MG tablet One tablet every six hours for pain.  Limit 7 days. 09/30/20   Sanjuana Kava, MD  lisinopril (PRINIVIL,ZESTRIL) 40 MG tablet Take 40 mg by mouth daily.    [provider]  metFORMIN (GLUCOPHAGE) 500 MG tablet Take 500 mg by mouth 2 (two) times daily with a meal.    [provider]  methocarbamol (ROBAXIN) 500 MG tablet Take 1 tablet (500 mg total) by mouth 3 (three) times daily. 09/30/20   Sanjuana Kava, MD  Multiple Vitamin (MULTIVITAMIN WITH MINERALS) TABS tablet Take 1 tablet by mouth daily.    [provider]  predniSONE (DELTASONE) 20 MG tablet Take 2 tablets (40 mg total) by mouth daily. 12/14/20   Nancy Chapel, MD  ranitidine (ZANTAC) 150 MG tablet Take 150 mg by mouth daily as needed for heartburn.     [provider]  rosuvastatin (CRESTOR) 20 MG tablet Take 1 tablet (20 mg total) by mouth daily. 01/31/14   Fay Records, MD  rosuvastatin (CRESTOR) 40 MG tablet Take 40 mg by mouth daily.    [provider]      Allergies    Augmentin [amoxicillin-pot clavulanate]    Review of Systems   Review of Systems  Musculoskeletal:  Positive for joint swelling and myalgias.  Skin:  Negative for rash.  All other systems reviewed and are negative.   Physical Exam Updated Vital Signs BP 138/66 (BP Location: Left Arm)   Pulse 78   Temp 98.4 F (36.9 C) (Oral)   Resp 18   Ht '5\' 6"'$  (1.676 m)   Wt 74.6 kg   SpO2 99%   BMI 26.54 kg/m  Physical Exam Vitals reviewed.  Constitutional:      Appearance: Normal appearance.  Cardiovascular:     Rate and Rhythm: Normal rate.  Pulmonary:  Effort: Pulmonary effort is normal.  Abdominal:     General: Abdomen is flat.  Musculoskeletal:        General: Swelling and tenderness present.     Comments: Tender right elbow,  swelling,  from  nv and ns intact   Skin:    General: Skin is warm.  Neurological:     General: No focal deficit present.     Mental Status: She is alert.  Psychiatric:        Mood and Affect: Mood normal.     ED Results / Procedures / Treatments   Labs (all labs ordered are listed, but only abnormal results are displayed) Labs Reviewed - No data to display  EKG None  Radiology No results found.  Procedures Procedures    Medications Ordered in ED Medications - No data to display  ED Course/ Medical Decision Making/ A&P                             Medical Decision  Making          Final Clinical Impression(s) / ED Diagnoses Final diagnoses:  Right elbow pain    Rx / DC Orders ED Discharge Orders     None     An After Visit Summary was printed and given to the patient. Scheduled Meds: Continuous Infusions: PRN Meds:.      Nancy Woodward 02/26/22 1548    Nancy Chapel, MD 02/26/22 2102

## 2022-02-26 NOTE — ED Notes (Addendum)
1 

## 2022-02-26 NOTE — ED Notes (Signed)
Patient complains of waking up and not being able to straighten out her right arm

## 2022-02-26 NOTE — ED Triage Notes (Signed)
Pt to ED c/o arm pain when woke up this morning. Pt reports unable to extend her arm all the way. No known injury. No loss of sensation.

## 2022-06-21 ENCOUNTER — Other Ambulatory Visit: Payer: Self-pay

## 2022-06-21 ENCOUNTER — Emergency Department (HOSPITAL_COMMUNITY)
Admission: EM | Admit: 2022-06-21 | Discharge: 2022-06-21 | Disposition: A | Discharge status: Home / Self Care | Payer: Medicare HMO | Attending: Student | Admitting: Student

## 2022-06-21 ENCOUNTER — Emergency Department (HOSPITAL_COMMUNITY): Payer: Medicare HMO

## 2022-06-21 ENCOUNTER — Encounter (HOSPITAL_COMMUNITY): Payer: Self-pay | Admitting: Emergency Medicine

## 2022-06-21 DIAGNOSIS — F17210 Nicotine dependence, cigarettes, uncomplicated: Secondary | ICD-10-CM | POA: Insufficient documentation

## 2022-06-21 DIAGNOSIS — M25572 Pain in left ankle and joints of left foot: Secondary | ICD-10-CM | POA: Diagnosis present

## 2022-06-21 DIAGNOSIS — I10 Essential (primary) hypertension: Secondary | ICD-10-CM | POA: Insufficient documentation

## 2022-06-21 DIAGNOSIS — Z79899 Other long term (current) drug therapy: Secondary | ICD-10-CM | POA: Insufficient documentation

## 2022-06-21 LAB — BASIC METABOLIC PANEL
Anion gap: 9 (ref 5–15)
BUN: 37 mg/dL — ABNORMAL HIGH (ref 8–23)
CO2: 25 mmol/L (ref 22–32)
Calcium: 9.5 mg/dL (ref 8.9–10.3)
Chloride: 107 mmol/L (ref 98–111)
Creatinine, Ser: 1.53 mg/dL — ABNORMAL HIGH (ref 0.44–1.00)
GFR, Estimated: 36 mL/min — ABNORMAL LOW (ref >60)
Glucose, Bld: 104 mg/dL — ABNORMAL HIGH (ref 70–99)
Potassium: 4.3 mmol/L (ref 3.5–5.1)
Sodium: 141 mmol/L (ref 135–145)

## 2022-06-21 LAB — URIC ACID: Uric Acid, Serum: 7.3 mg/dL — ABNORMAL HIGH (ref 2.5–7.1)

## 2022-06-21 LAB — CBC WITH DIFFERENTIAL/PLATELET
Abs Immature Granulocytes: 0.02 10*3/uL (ref 0.00–0.07)
Basophils Absolute: 0 10*3/uL (ref 0.0–0.1)
Basophils Relative: 1 %
Eosinophils Absolute: 0.2 10*3/uL (ref 0.0–0.5)
Eosinophils Relative: 3 %
HCT: 34.5 % — ABNORMAL LOW (ref 36.0–46.0)
Hemoglobin: 11.1 g/dL — ABNORMAL LOW (ref 12.0–15.0)
Immature Granulocytes: 0 %
Lymphocytes Relative: 35 %
Lymphs Abs: 2.2 10*3/uL (ref 0.7–4.0)
MCH: 30.2 pg (ref 26.0–34.0)
MCHC: 32.2 g/dL (ref 30.0–36.0)
MCV: 93.8 fL (ref 80.0–100.0)
Monocytes Absolute: 0.5 10*3/uL (ref 0.1–1.0)
Monocytes Relative: 8 %
Neutro Abs: 3.2 10*3/uL (ref 1.7–7.7)
Neutrophils Relative %: 53 %
Platelets: 261 10*3/uL (ref 150–400)
RBC: 3.68 MIL/uL — ABNORMAL LOW (ref 3.87–5.11)
RDW: 13.2 % (ref 11.5–15.5)
WBC: 6.1 10*3/uL (ref 4.0–10.5)
nRBC: 0 % (ref 0.0–0.2)

## 2022-06-21 MED ORDER — METHYLPREDNISOLONE 4 MG PO TBPK: ORAL_TABLET | ORAL | 0 refills | Status: AC

## 2022-06-21 MED ORDER — COLCHICINE 0.6 MG PO TABS
0.6000 mg | ORAL_TABLET | Freq: Once | ORAL | Status: AC
  Administered 2022-06-21: 0.6 mg via ORAL
  Filled 2022-06-21: qty 1

## 2022-06-21 MED ORDER — PREDNISONE 20 MG PO TABS
40.0000 mg | ORAL_TABLET | Freq: Once | ORAL | Status: AC
  Administered 2022-06-21: 40 mg via ORAL
  Filled 2022-06-21: qty 2

## 2022-06-21 MED ORDER — NAPROXEN 250 MG PO TABS
500.0000 mg | ORAL_TABLET | Freq: Once | ORAL | Status: AC
  Administered 2022-06-21: 500 mg via ORAL
  Filled 2022-06-21: qty 2

## 2022-06-21 NOTE — ED Triage Notes (Signed)
Pt recently went to her PCP for similar pain. Pt was diagnosed with Gout and prescribed allopurinol. Pt does not think it is Gout so she came in here today for a different treatment.

## 2022-06-21 NOTE — ED Provider Notes (Signed)
[name] Provider Note  CSN: [id number] Arrival date & time: 06/21/22 4098  Chief Complaint(s) left foot swelling and right leg and foot tingling  HPI Nancy Woodward is a 72 y.o. female with PMH GERD, HLD, HTN who presents emergency room for evaluation of left ankle pain and swelling.  She states that pain began last week and she states that her primary care physician told her she had gout and started her on allopurinol.  She has been taking no other medications but currently is having difficulty walking on the ankle secondary to pain.  She denies any dietary changes including excess meat or alcohol intake.  No fever, chills or erythema to the joint.  Patient has not had formal orthopedic evaluation of this joint.   Past Medical History Past Medical History:  Diagnosis Date   GERD (gastroesophageal reflux disease)    Hypercholesteremia    Hypertension    Kidney stone    Pre-diabetes    Patient Active Problem List   Diagnosis Date Noted   Anxiety 09/30/2020   Benign essential hypertension 09/30/2020   Gastroesophageal reflux disease 09/30/2020   Other long term (current) drug therapy 09/30/2020   Mixed hyperlipidemia 09/30/2020   Other specified abnormal findings of blood chemistry 09/30/2020   Pain in right foot 09/30/2020   Prediabetes 09/30/2020   Sciatica 09/30/2020   Tobacco abuse 09/30/2020   Special screening for malignant neoplasms, colon    Home Medication(s) Prior to Admission medications   Medication Sig Start Date End Date Taking? Authorizing Provider  aspirin EC 81 MG tablet Take 81 mg by mouth daily.    [provider]  aspirin-acetaminophen-caffeine (EXCEDRIN MIGRAINE) [phone] MG tablet Take 2 tablets by mouth daily as needed for headache.     [provider]  famotidine (PEPCID) 20 MG tablet Take 1 tablet (20 mg total) by mouth 2 (two) times daily. 12/14/20   Eber Hong, MD   hydrochlorothiazide (HYDRODIURIL) 25 MG tablet Take 25 mg by mouth daily.  02/13/15   [provider]  HYDROcodone-acetaminophen (NORCO/VICODIN) 5-325 MG tablet One tablet every six hours for pain.  Limit 7 days. 02/26/22   Elson Areas, PA-C  ibuprofen (ADVIL) 800 MG tablet Take 1 tablet (800 mg total) by mouth every 8 (eight) hours as needed. 02/26/22   Elson Areas, PA-C  lisinopril (PRINIVIL,ZESTRIL) 40 MG tablet Take 40 mg by mouth daily.    [provider]  metFORMIN (GLUCOPHAGE) 500 MG tablet Take 500 mg by mouth 2 (two) times daily with a meal.    [provider]  methocarbamol (ROBAXIN) 500 MG tablet Take 1 tablet (500 mg total) by mouth 3 (three) times daily. 09/30/20   Darreld Mclean, MD  Multiple Vitamin (MULTIVITAMIN WITH MINERALS) TABS tablet Take 1 tablet by mouth daily.    [provider]  predniSONE (DELTASONE) 20 MG tablet Take 2 tablets (40 mg total) by mouth daily. 12/14/20   Eber Hong, MD  ranitidine (ZANTAC) 150 MG tablet Take 150 mg by mouth daily as needed for heartburn.     [provider]  rosuvastatin (CRESTOR) 20 MG tablet Take 1 tablet (20 mg total) by mouth daily. 01/31/14   Pricilla Riffle, MD  rosuvastatin (CRESTOR) 40 MG tablet Take 40 mg by mouth daily.    [provider]  Past Surgical History Past Surgical History:  Procedure Laterality Date   ABDOMINAL HYSTERECTOMY     COLONOSCOPY N/A 08/22/2017   Procedure: COLONOSCOPY;  Surgeon: West Bali, MD;  Location: [facility];  Service: Endoscopy;  Laterality: N/A;  9:30   KIDNEY STONE SURGERY     POLYPECTOMY  08/22/2017   Procedure: POLYPECTOMY;  Surgeon: West Bali, MD;  Location: [facility];  Service: Endoscopy;;  decending   tumor of ovary removed     Family History Family History  Problem Relation Age of  Onset   Colon polyps Neg Hx    Colon cancer Neg Hx     Social History Social History   Tobacco Use   Smoking status: Some Days    Packs/day: .5    Types: Cigarettes   Smokeless tobacco: Never  Vaping Use   Vaping Use: Never used  Substance Use Topics   Alcohol use: No   Drug use: No   Allergies Augmentin [amoxicillin-pot clavulanate]  Review of Systems Review of Systems  Musculoskeletal:  Positive for joint swelling.    Physical Exam Vital Signs  I have reviewed the triage vital signs BP 126/69 (BP Location: Left Arm)   Pulse 64   Temp 98.4 F (36.9 C) (Oral)   Resp 16   Ht 5\' 6"  (1.676 m)   Wt 74 kg   SpO2 99%   BMI 26.33 kg/m   Physical Exam Vitals and nursing note reviewed.  Constitutional:      General: She is not in acute distress.    Appearance: She is well-developed.  HENT:     Head: Normocephalic and atraumatic.  Eyes:     Conjunctiva/sclera: Conjunctivae normal.  Cardiovascular:     Rate and Rhythm: Normal rate and regular rhythm.     Heart sounds: No murmur heard. Pulmonary:     Effort: Pulmonary effort is normal. No respiratory distress.     Breath sounds: Normal breath sounds.  Abdominal:     Palpations: Abdomen is soft.     Tenderness: There is no abdominal tenderness.  Musculoskeletal:        General: Swelling and tenderness present.     Cervical back: Neck supple.  Skin:    General: Skin is warm and dry.     Capillary Refill: Capillary refill takes less than 2 seconds.  Neurological:     Mental Status: She is alert.  Psychiatric:        Mood and Affect: Mood normal.     ED Results and Treatments Labs (all labs ordered are listed, but only abnormal results are displayed) Labs Reviewed  CBC WITH DIFFERENTIAL/PLATELET - Abnormal; Notable for the following components:      Result Value   RBC 3.68 (*)    Hemoglobin 11.1 (*)    HCT 34.5 (*)    All other components within normal limits  BASIC METABOLIC PANEL - Abnormal; Notable  for the following components:   Glucose, Bld 104 (*)    BUN 37 (*)    Creatinine, Ser 1.53 (*)    GFR, Estimated 36 (*)    All other components within normal limits  URIC ACID - Abnormal; Notable for the following components:   Uric Acid, Serum 7.3 (*)    All other components within normal limits  Radiology No results found.  Pertinent labs & imaging results that were available during my care of the patient were reviewed by me and considered in my medical decision making (see MDM for details).  Medications Ordered in ED Medications  naproxen (NAPROSYN) tablet 500 mg (500 mg Oral Given 06/21/22 0754)                                                                                                                                     Procedures Procedures  (including critical care time)  Medical Decision Making / ED Course   This patient presents to the ED for concern of ankle pain and swelling, this involves an extensive number of treatment options, and is a complaint that carries with it a high risk of complications and morbidity.  The differential diagnosis includes fracture, ligamentous injury, contusion, hematoma, gout, pseudogout  MDM: Patient seen emergency room for evaluation of ankle pain and swelling.  Physical exam with mild swelling of the left ankle and over the dorsum of the left foot but is otherwise unremarkable.  Laboratory evaluation with a hemoglobin of 11.1, BUN 37, creatinine 1.53 which is slightly elevated from baseline.  Patient's uric acid is elevated at 7.3.  X-ray unremarkable.  Patient given single dose Naprosyn, prednisone and colchicine and on reevaluation her symptoms have significant improved.  Ankle will be wrapped in an Ace wrap and ice pack given.  Patient presentation likely consistent with gout given unilateral joint swelling with  elevated uric acid but will have the patient follow-up outpatient with orthopedics for confirmatory joint aspiration if this is deemed necessary in the outpatient setting.  With patient's chronic kidney disease, I did inform the patient that she should avoid additional NSAIDs despite single dose in the ER today and a Medrol Dosepak was sent for suspected gout flare.  Patient then discharged with outpatient orthopedic follow-up.  Patient given return precautions which she voiced understanding.   Additional history obtained:  -External records from outside source obtained and reviewed including: Chart review including previous notes, labs, imaging, consultation notes   Lab Tests: -I ordered, reviewed, and interpreted labs.   The pertinent results include:   Labs Reviewed  CBC WITH DIFFERENTIAL/PLATELET - Abnormal; Notable for the following components:      Result Value   RBC 3.68 (*)    Hemoglobin 11.1 (*)    HCT 34.5 (*)    All other components within normal limits  BASIC METABOLIC PANEL - Abnormal; Notable for the following components:   Glucose, Bld 104 (*)    BUN 37 (*)    Creatinine, Ser 1.53 (*)    GFR, Estimated 36 (*)    All other components within normal limits  URIC ACID - Abnormal; Notable for the following components:   Uric Acid, Serum 7.3 (*)    All other components within normal limits       Imaging Studies ordered:  I ordered imaging studies including ankle x-ray I independently visualized and interpreted imaging. I agree with the radiologist interpretation   Medicines ordered and prescription drug management: Meds ordered this encounter  Medications   naproxen (NAPROSYN) tablet 500 mg    -I have reviewed the patients home medicines and have made adjustments as needed  Critical interventions none   Cardiac Monitoring: The patient was maintained on a cardiac monitor.  I personally viewed and interpreted the cardiac monitored which showed an underlying  rhythm of: NSR  Social Determinants of Health:  Factors impacting patients care include: none   Reevaluation: After the interventions noted above, I reevaluated the patient and found that they have :improved  Co morbidities that complicate the patient evaluation  Past Medical History:  Diagnosis Date   GERD (gastroesophageal reflux disease)    Hypercholesteremia    Hypertension    Kidney stone    Pre-diabetes       Dispostion: I considered admission for this patient, but at this time she does not meet inpatient criteria for admission and she is safe for discharge with outpatient follow-up     Final Clinical Impression(s) / ED Diagnoses Final diagnoses:  None     @PCDICTATION @    Glendora Score, MD 06/21/22 [phone]

## 2022-06-24 ENCOUNTER — Encounter: Payer: Self-pay | Admitting: *Deleted

## 2022-07-13 ENCOUNTER — Emergency Department (HOSPITAL_COMMUNITY)
Admission: EM | Admit: 2022-07-13 | Discharge: 2022-07-13 | Disposition: A | Discharge status: Home / Self Care | Payer: Medicare HMO | Attending: Emergency Medicine | Admitting: Emergency Medicine

## 2022-07-13 ENCOUNTER — Other Ambulatory Visit: Payer: Self-pay

## 2022-07-13 DIAGNOSIS — Z79899 Other long term (current) drug therapy: Secondary | ICD-10-CM | POA: Diagnosis not present

## 2022-07-13 DIAGNOSIS — R238 Other skin changes: Secondary | ICD-10-CM | POA: Diagnosis present

## 2022-07-13 DIAGNOSIS — I10 Essential (primary) hypertension: Secondary | ICD-10-CM | POA: Insufficient documentation

## 2022-07-13 MED ORDER — CEPHALEXIN 500 MG PO CAPS: 500.0000 mg | ORAL_CAPSULE | Freq: Four times a day (QID) | ORAL | 0 refills | Status: AC

## 2022-07-13 MED ORDER — CEPHALEXIN 500 MG PO CAPS
500.0000 mg | ORAL_CAPSULE | Freq: Once | ORAL | Status: AC
  Administered 2022-07-13: 500 mg via ORAL
  Filled 2022-07-13: qty 1

## 2022-07-13 NOTE — ED Triage Notes (Signed)
Pt c/o blister like bumps on left leg that burn and itch, reports she had two spots on her rigt leg last week as well but those have healed.

## 2022-07-13 NOTE — Discharge Instructions (Signed)
Begin taking Keflex as prescribed.  Keep the lesion clean and covered.  Follow-up with primary doctor if not improving.

## 2022-07-13 NOTE — ED Provider Notes (Signed)
[facility] AT [facility] Provider Note   CSN: [id number] Arrival date & time: 07/13/22  4098     History  No chief complaint on file.   Nancy Woodward is a 72 y.o. female.  Patient is a 72 year old female with history of hypertension, hyperlipidemia.  Patient presenting with complaints of small blisters to the inside of her left thigh.  Patient had similar blisters to her right lower leg last week, and now is noticing these to the inside of the thigh.  The area is red and itchy with some surrounding erythema.  No fevers or chills.  She denies any insect bites or stings.  The history is provided by the patient.       Home Medications Prior to Admission medications   Medication Sig Start Date End Date Taking? Authorizing Provider  aspirin EC 81 MG tablet Take 81 mg by mouth daily.    [provider]  aspirin-acetaminophen-caffeine (EXCEDRIN MIGRAINE) [phone] MG tablet Take 2 tablets by mouth daily as needed for headache.     [provider]  famotidine (PEPCID) 20 MG tablet Take 1 tablet (20 mg total) by mouth 2 (two) times daily. 12/14/20   Eber Hong, MD  hydrochlorothiazide (HYDRODIURIL) 25 MG tablet Take 25 mg by mouth daily.  02/13/15   [provider]  HYDROcodone-acetaminophen (NORCO/VICODIN) 5-325 MG tablet One tablet every six hours for pain.  Limit 7 days. 02/26/22   Elson Areas, PA-C  ibuprofen (ADVIL) 800 MG tablet Take 1 tablet (800 mg total) by mouth every 8 (eight) hours as needed. 02/26/22   Elson Areas, PA-C  lisinopril (PRINIVIL,ZESTRIL) 40 MG tablet Take 40 mg by mouth daily.    [provider]  metFORMIN (GLUCOPHAGE) 500 MG tablet Take 500 mg by mouth 2 (two) times daily with a meal.    [provider]  methocarbamol (ROBAXIN) 500 MG tablet Take 1 tablet (500 mg total) by mouth 3 (three) times daily. 09/30/20   Darreld Mclean, MD  methylPREDNISolone (MEDROL DOSEPAK) 4 MG TBPK  tablet Take as prescribed 06/21/22   Kommor, Madison, MD  Multiple Vitamin (MULTIVITAMIN WITH MINERALS) TABS tablet Take 1 tablet by mouth daily.    [provider]  ranitidine (ZANTAC) 150 MG tablet Take 150 mg by mouth daily as needed for heartburn.     [provider]  rosuvastatin (CRESTOR) 20 MG tablet Take 1 tablet (20 mg total) by mouth daily. 01/31/14   Pricilla Riffle, MD  rosuvastatin (CRESTOR) 40 MG tablet Take 40 mg by mouth daily.    [provider]      Allergies    Augmentin [amoxicillin-pot clavulanate]    Review of Systems   Review of Systems  All other systems reviewed and are negative.   Physical Exam Updated Vital Signs BP 121/77   Pulse 72   Temp 98.6 F (37 C) (Oral)   Resp 17   Ht 5\' 6"  (1.676 m)   Wt 74 kg   SpO2 99%   BMI 26.33 kg/m  Physical Exam Vitals and nursing note reviewed.  Constitutional:      Appearance: Normal appearance.  Pulmonary:     Effort: Pulmonary effort is normal.  Skin:    General: Skin is warm and dry.     Comments: To the inside of the left thigh, there is a small, purple-colored, fluid-filled vesicle with surrounding erythema  Neurological:     Mental Status: She is alert and oriented  to person, place, and time.     ED Results / Procedures / Treatments   Labs (all labs ordered are listed, but only abnormal results are displayed) Labs Reviewed - No data to display  EKG None  Radiology No results found.  Procedures Procedures    Medications Ordered in ED Medications - No data to display  ED Course/ Medical Decision Making/ A&P  Patient presenting with a vesicle noted to the inside of her left thigh 1 week after noticing the same to her right lower leg.  There is a small vesicular lesion noted that is purple in color and fluid-filled.  I am uncertain as to the exact etiology of this, but could be an insect sting, possibly a chigger bite.  Patient will be treated with Keflex for the  surrounding erythema.  To follow-up as needed.  Final Clinical Impression(s) / ED Diagnoses Final diagnoses:  None    Rx / DC Orders ED Discharge Orders     None         Geoffery Lyons, MD 07/13/22 [phone]

## 2022-08-03 ENCOUNTER — Other Ambulatory Visit: Payer: Self-pay

## 2022-08-03 ENCOUNTER — Other Ambulatory Visit (INDEPENDENT_AMBULATORY_CARE_PROVIDER_SITE_OTHER): Payer: Medicare HMO

## 2022-08-03 ENCOUNTER — Ambulatory Visit: Payer: Medicare HMO | Admitting: Orthopedic Surgery

## 2022-08-03 ENCOUNTER — Encounter: Payer: Self-pay | Admitting: Orthopedic Surgery

## 2022-08-03 VITALS — BP 110/68 | HR 97 | Ht 66.0 in | Wt 160.0 lb

## 2022-08-03 DIAGNOSIS — M79672 Pain in left foot: Secondary | ICD-10-CM

## 2022-08-03 DIAGNOSIS — M79671 Pain in right foot: Secondary | ICD-10-CM | POA: Diagnosis not present

## 2022-08-03 NOTE — Progress Notes (Signed)
New Patient Visit  Assessment: Nancy Woodward is a 72 y.o. female with the following: 1. Foot pain, bilateral  Plan: Nancy Woodward has pain in both feet.  No specific injury.  Pain and swelling was diffuse, but it has improved.  She was evaluated in the ED and they expressed concerns for gout.  No prior diagnosis.  She was given some medications to treat gout, but this was not changing her symptoms.  She is no longer taking medications for gout.  She recently started exercising more frequently.  No specific injury.  Radiographs are without acute injury.  No evidence of stress fracture.  On physical exam, she has no swelling today.  She does have some tenderness over the midfoot on the right, but otherwise no obvious laxity or swelling.  Provided reassurance.  Medications as needed.  Continue to take medications for diabetes as instructed.  Follow-up in clinic as needed.  Follow-up: Return if symptoms worsen or fail to improve.  Subjective:  Chief Complaint  Patient presents with   Foot Pain    States both feet have been very painful and swollen / swelling has gotten better / started with left now is both / was seen in ER     History of Present Illness: Nancy Woodward is a 72 y.o. female who presents for evaluation of bilateral foot pain.  She states she has had pain and swelling in both feet for the past month.  Started in her left foot.  No specific injury.  She has not rolled her ankles.  She recently started walking on a more consistent basis.  This may have provoked her pain.  She was evaluated in the emergency department for left foot pain and swelling, and there is concern for gout.  She was provided with Medications, but this has not improved her symptoms.  She is no longer taking these medications.  She states that the swelling in both feet has improved since being evaluated in the emergency department.  No history of gout.   Review of Systems: No fevers or chills No  numbness or tingling No chest pain No shortness of breath No bowel or bladder dysfunction No GI distress No headaches   Medical History:  Past Medical History:  Diagnosis Date   GERD (gastroesophageal reflux disease)    Hypercholesteremia    Hypertension    Kidney stone    Pre-diabetes     Past Surgical History:  Procedure Laterality Date   ABDOMINAL HYSTERECTOMY     COLONOSCOPY N/A 08/22/2017   Procedure: COLONOSCOPY;  Surgeon: West Bali, MD;  Location: [facility];  Service: Endoscopy;  Laterality: N/A;  9:30   KIDNEY STONE SURGERY     POLYPECTOMY  08/22/2017   Procedure: POLYPECTOMY;  Surgeon: West Bali, MD;  Location: [facility];  Service: Endoscopy;;  decending   tumor of ovary removed      Family History  Problem Relation Age of Onset   Colon polyps Neg Hx    Colon cancer Neg Hx    Social History   Tobacco Use   Smoking status: Some Days    Packs/day: .5    Types: Cigarettes   Smokeless tobacco: Never  Vaping Use   Vaping Use: Never used  Substance Use Topics   Alcohol use: No   Drug use: No    Allergies  Allergen Reactions   Augmentin [Amoxicillin-Pot Clavulanate] Nausea And Vomiting    Has patient had a PCN reaction causing immediate rash,  facial/tongue/throat swelling, SOB or lightheadedness with hypotension: No Has patient had a PCN reaction causing severe rash involving mucus membranes or skin necrosis: No Has patient had a PCN reaction that required hospitalization: No Has patient had a PCN reaction occurring within the last 10 years: No If all of the above answers are "NO", then may proceed with Cephalosporin use.     Current Meds  Medication Sig   allopurinol (ZYLOPRIM) 100 MG tablet Take 100 mg by mouth daily.   aspirin EC 81 MG tablet Take 81 mg by mouth daily.   aspirin-acetaminophen-caffeine (EXCEDRIN MIGRAINE) 250-250-65 MG tablet Take 2 tablets by mouth daily as needed for headache.    cephALEXin (KEFLEX) 500 MG  capsule Take 1 capsule (500 mg total) by mouth 4 (four) times daily.   famotidine (PEPCID) 20 MG tablet Take 1 tablet (20 mg total) by mouth 2 (two) times daily.   hydrochlorothiazide (HYDRODIURIL) 25 MG tablet Take 25 mg by mouth daily.    HYDROcodone-acetaminophen (NORCO/VICODIN) 5-325 MG tablet One tablet every six hours for pain.  Limit 7 days.   ibuprofen (ADVIL) 800 MG tablet Take 1 tablet (800 mg total) by mouth every 8 (eight) hours as needed.   lisinopril (PRINIVIL,ZESTRIL) 40 MG tablet Take 40 mg by mouth daily.   metFORMIN (GLUCOPHAGE) 500 MG tablet Take 500 mg by mouth 2 (two) times daily with a meal.   methocarbamol (ROBAXIN) 500 MG tablet Take 1 tablet (500 mg total) by mouth 3 (three) times daily.   methylPREDNISolone (MEDROL DOSEPAK) 4 MG TBPK tablet Take as prescribed   Multiple Vitamin (MULTIVITAMIN WITH MINERALS) TABS tablet Take 1 tablet by mouth daily.   ranitidine (ZANTAC) 150 MG tablet Take 150 mg by mouth daily as needed for heartburn.    rosuvastatin (CRESTOR) 20 MG tablet Take 1 tablet (20 mg total) by mouth daily.   rosuvastatin (CRESTOR) 40 MG tablet Take 40 mg by mouth daily.    Objective: BP 110/68   Pulse 97   Ht 5\' 6"  (1.676 m)   Wt 160 lb (72.6 kg)   BMI 25.82 kg/m   Physical Exam:  General: Alert and oriented. and No acute distress. Gait: Normal gait.  Motion of bilateral feet demonstrates no swelling.  No bruising.  She does have some mild tenderness to palpation of the lateral aspect of the midfoot on the right.  No pain with inversion or eversion bilaterally.  Negative anterior drawer.  Toes warm and well-perfused.  Slight decrease sensation to all toes in bilateral feet.  IMAGING: I personally ordered and reviewed the following images  X-rays of bilateral feet were obtained in clinic today.  These are nonweightbearing views.  No acute injuries are noted.  No callus formation concerning for a stress fracture.  No dislocation.  Good overall bone  quality.  Impression: Negative bilateral foot x-rays.   New Medications:  No orders of the defined types were placed in this encounter.     Oliver Barre, MD  08/03/2022 2:30 PM

## 2023-12-22 ENCOUNTER — Other Ambulatory Visit (HOSPITAL_COMMUNITY): Payer: Self-pay | Admitting: Internal Medicine

## 2023-12-22 ENCOUNTER — Encounter (INDEPENDENT_AMBULATORY_CARE_PROVIDER_SITE_OTHER): Payer: Self-pay | Admitting: *Deleted

## 2023-12-22 DIAGNOSIS — M8580 Other specified disorders of bone density and structure, unspecified site: Secondary | ICD-10-CM

## 2023-12-26 ENCOUNTER — Other Ambulatory Visit (HOSPITAL_COMMUNITY): Payer: Self-pay | Admitting: Internal Medicine

## 2023-12-26 DIAGNOSIS — Z1231 Encounter for screening mammogram for malignant neoplasm of breast: Secondary | ICD-10-CM

## 2024-01-11 ENCOUNTER — Ambulatory Visit (HOSPITAL_COMMUNITY)
Admission: RE | Admit: 2024-01-11 | Discharge: 2024-01-11 | Disposition: A | Discharge status: Home / Self Care | Source: Ambulatory Visit | Attending: Internal Medicine | Admitting: Internal Medicine

## 2024-01-11 ENCOUNTER — Encounter (HOSPITAL_COMMUNITY): Payer: Self-pay

## 2024-01-11 DIAGNOSIS — Z1231 Encounter for screening mammogram for malignant neoplasm of breast: Secondary | ICD-10-CM | POA: Insufficient documentation

## 2024-01-11 DIAGNOSIS — M8589 Other specified disorders of bone density and structure, multiple sites: Secondary | ICD-10-CM | POA: Insufficient documentation

## 2024-01-11 DIAGNOSIS — M8580 Other specified disorders of bone density and structure, unspecified site: Secondary | ICD-10-CM
